# Patient Record
Sex: Male | Born: 1980 | Race: White | Hispanic: No | Marital: Married | Smoking: Never smoker
Health system: Southern US, Community
[De-identification: ages and names within clinical notes are randomized; demographics above are authoritative.]

## PROBLEM LIST (undated history)

## (undated) DIAGNOSIS — N200 Calculus of kidney: Secondary | ICD-10-CM

## (undated) HISTORY — DX: Calculus of kidney: N20.0

---

## 2002-08-17 ENCOUNTER — Emergency Department (HOSPITAL_COMMUNITY): Admission: EM | Admit: 2002-08-17 | Discharge: 2002-08-17 | Payer: Self-pay | Admitting: Emergency Medicine

## 2002-08-17 ENCOUNTER — Encounter: Payer: Self-pay | Admitting: Emergency Medicine

## 2004-02-05 HISTORY — PX: EXTRACORPOREAL SHOCK WAVE LITHOTRIPSY: SHX1557

## 2007-03-11 ENCOUNTER — Emergency Department: Payer: Self-pay | Admitting: Emergency Medicine

## 2007-03-12 ENCOUNTER — Ambulatory Visit: Payer: Self-pay | Admitting: Specialist

## 2015-06-14 ENCOUNTER — Observation Stay
Admission: EM | Admit: 2015-06-14 | Discharge: 2015-06-15 | Disposition: A | Discharge status: Home / Self Care | Payer: No Typology Code available for payment source | Attending: Urology | Admitting: Urology

## 2015-06-14 ENCOUNTER — Encounter: Payer: Self-pay | Admitting: Emergency Medicine

## 2015-06-14 ENCOUNTER — Observation Stay: Payer: No Typology Code available for payment source | Admitting: Certified Registered"

## 2015-06-14 ENCOUNTER — Encounter
Admission: EM | Disposition: A | Discharge status: Home / Self Care | Payer: Self-pay | Source: Home / Self Care | Attending: Emergency Medicine

## 2015-06-14 ENCOUNTER — Emergency Department: Payer: No Typology Code available for payment source

## 2015-06-14 DIAGNOSIS — N132 Hydronephrosis with renal and ureteral calculous obstruction: Secondary | ICD-10-CM

## 2015-06-14 DIAGNOSIS — N200 Calculus of kidney: Secondary | ICD-10-CM | POA: Diagnosis not present

## 2015-06-14 DIAGNOSIS — N179 Acute kidney failure, unspecified: Secondary | ICD-10-CM | POA: Insufficient documentation

## 2015-06-14 DIAGNOSIS — R109 Unspecified abdominal pain: Secondary | ICD-10-CM | POA: Insufficient documentation

## 2015-06-14 DIAGNOSIS — R918 Other nonspecific abnormal finding of lung field: Secondary | ICD-10-CM | POA: Diagnosis not present

## 2015-06-14 DIAGNOSIS — N134 Hydroureter: Secondary | ICD-10-CM

## 2015-06-14 DIAGNOSIS — N202 Calculus of kidney with calculus of ureter: Principal | ICD-10-CM | POA: Insufficient documentation

## 2015-06-14 DIAGNOSIS — R52 Pain, unspecified: Secondary | ICD-10-CM

## 2015-06-14 HISTORY — PX: CYSTOSCOPY/URETEROSCOPY/HOLMIUM LASER/STENT PLACEMENT: SHX6546

## 2015-06-14 LAB — CBC WITH DIFFERENTIAL/PLATELET
BASOS ABS: 0.1 10*3/uL (ref 0–0.1)
Eosinophils Absolute: 0.1 10*3/uL (ref 0–0.7)
Eosinophils Relative: 1 %
HEMATOCRIT: 45.7 % (ref 40.0–52.0)
HEMOGLOBIN: 15.7 g/dL (ref 13.0–18.0)
Lymphocytes Relative: 9 %
Lymphs Abs: 1.3 10*3/uL (ref 1.0–3.6)
MCH: 28.2 pg (ref 26.0–34.0)
MCHC: 34.4 g/dL (ref 32.0–36.0)
MCV: 82.2 fL (ref 80.0–100.0)
Monocytes Absolute: 0.6 10*3/uL (ref 0.2–1.0)
Monocytes Relative: 5 %
NEUTROS ABS: 11.6 10*3/uL — AB (ref 1.4–6.5)
Platelets: 240 10*3/uL (ref 150–440)
RBC: 5.56 MIL/uL (ref 4.40–5.90)
RDW: 13.6 % (ref 11.5–14.5)
WBC: 13.7 10*3/uL — ABNORMAL HIGH (ref 3.8–10.6)

## 2015-06-14 LAB — URINALYSIS COMPLETE WITH MICROSCOPIC (ARMC ONLY)
BACTERIA UA: NONE SEEN
BILIRUBIN URINE: NEGATIVE
GLUCOSE, UA: NEGATIVE mg/dL
Ketones, ur: NEGATIVE mg/dL
Leukocytes, UA: NEGATIVE
Nitrite: NEGATIVE
Protein, ur: NEGATIVE mg/dL
SQUAMOUS EPITHELIAL / LPF: NONE SEEN
Specific Gravity, Urine: 1.02 (ref 1.005–1.030)
pH: 6 (ref 5.0–8.0)

## 2015-06-14 LAB — BASIC METABOLIC PANEL
ANION GAP: 10 (ref 5–15)
BUN: 20 mg/dL (ref 6–20)
CALCIUM: 9.6 mg/dL (ref 8.9–10.3)
CO2: 25 mmol/L (ref 22–32)
CREATININE: 1.31 mg/dL — AB (ref 0.61–1.24)
Chloride: 102 mmol/L (ref 101–111)
GLUCOSE: 151 mg/dL — AB (ref 65–99)
Potassium: 3.6 mmol/L (ref 3.5–5.1)
Sodium: 137 mmol/L (ref 135–145)

## 2015-06-14 SURGERY — CYSTOSCOPY/URETEROSCOPY/HOLMIUM LASER/STENT PLACEMENT
Anesthesia: General | Laterality: Right

## 2015-06-14 MED ORDER — SODIUM CHLORIDE 0.9 % IV BOLUS (SEPSIS)
1000.0000 mL | Freq: Once | INTRAVENOUS | Status: AC
  Administered 2015-06-14: 1000 mL via INTRAVENOUS

## 2015-06-14 MED ORDER — HYDROMORPHONE HCL 1 MG/ML IJ SOLN
INTRAMUSCULAR | Status: AC
  Administered 2015-06-14: 1 mg via INTRAVENOUS
  Filled 2015-06-14: qty 1

## 2015-06-14 MED ORDER — ONDANSETRON HCL 4 MG/2ML IJ SOLN
4.0000 mg | Freq: Once | INTRAMUSCULAR | Status: AC
  Administered 2015-06-14: 4 mg via INTRAVENOUS

## 2015-06-14 MED ORDER — BELLADONNA ALKALOIDS-OPIUM 16.2-60 MG RE SUPP: 1.0000 | Freq: Three times a day (TID) | RECTAL | Status: DC | PRN

## 2015-06-14 MED ORDER — MORPHINE SULFATE (PF) 2 MG/ML IV SOLN
2.0000 mg | INTRAVENOUS | Status: DC | PRN
  Administered 2015-06-14 (×2): 4 mg via INTRAVENOUS
  Filled 2015-06-14 (×3): qty 2

## 2015-06-14 MED ORDER — TAMSULOSIN HCL 0.4 MG PO CAPS: 0.4000 mg | ORAL_CAPSULE | Freq: Every day | ORAL | Status: DC

## 2015-06-14 MED ORDER — MORPHINE SULFATE (PF) 4 MG/ML IV SOLN
4.0000 mg | Freq: Once | INTRAVENOUS | Status: AC
  Administered 2015-06-14: 4 mg via INTRAVENOUS
  Filled 2015-06-14: qty 1

## 2015-06-14 MED ORDER — ONDANSETRON HCL 4 MG/2ML IJ SOLN
4.0000 mg | INTRAMUSCULAR | Status: DC | PRN
  Administered 2015-06-14 (×3): 4 mg via INTRAVENOUS
  Filled 2015-06-14 (×3): qty 2

## 2015-06-14 MED ORDER — OXYBUTYNIN CHLORIDE 5 MG PO TABS
5.0000 mg | ORAL_TABLET | Freq: Three times a day (TID) | ORAL | Status: DC | PRN
  Filled 2015-06-14: qty 1

## 2015-06-14 MED ORDER — OXYCODONE-ACETAMINOPHEN 5-325 MG PO TABS: 1.0000 | ORAL_TABLET | ORAL | Status: DC | PRN

## 2015-06-14 MED ORDER — HYDROMORPHONE HCL 1 MG/ML IJ SOLN
1.0000 mg | Freq: Once | INTRAMUSCULAR | Status: AC
  Administered 2015-06-14: 1 mg via INTRAVENOUS

## 2015-06-14 MED ORDER — TAMSULOSIN HCL 0.4 MG PO CAPS
0.4000 mg | ORAL_CAPSULE | Freq: Every day | ORAL | Status: DC
  Administered 2015-06-15: 0.4 mg via ORAL
  Filled 2015-06-14: qty 1

## 2015-06-14 MED ORDER — LIDOCAINE HCL (CARDIAC) 20 MG/ML IV SOLN
INTRAVENOUS | Status: DC | PRN
  Administered 2015-06-14: 100 mg via INTRAVENOUS

## 2015-06-14 MED ORDER — CEFAZOLIN SODIUM-DEXTROSE 2-3 GM-% IV SOLR
INTRAVENOUS | Status: DC | PRN
  Administered 2015-06-14: 2 g via INTRAVENOUS

## 2015-06-14 MED ORDER — ROCURONIUM BROMIDE 100 MG/10ML IV SOLN
INTRAVENOUS | Status: DC | PRN
  Administered 2015-06-14: 30 mg via INTRAVENOUS
  Administered 2015-06-14 (×2): 5 mg via INTRAVENOUS
  Administered 2015-06-14: 15 mg via INTRAVENOUS

## 2015-06-14 MED ORDER — ONDANSETRON 4 MG PO TBDP
ORAL_TABLET | ORAL | Status: AC
  Administered 2015-06-14: 4 mg via ORAL
  Filled 2015-06-14: qty 1

## 2015-06-14 MED ORDER — ACETAMINOPHEN 325 MG PO TABS
650.0000 mg | ORAL_TABLET | ORAL | Status: DC | PRN
  Administered 2015-06-15 (×2): 650 mg via ORAL
  Filled 2015-06-14 (×2): qty 2

## 2015-06-14 MED ORDER — TAMSULOSIN HCL 0.4 MG PO CAPS
0.4000 mg | ORAL_CAPSULE | Freq: Once | ORAL | Status: AC
  Administered 2015-06-14: 0.4 mg via ORAL
  Filled 2015-06-14: qty 1

## 2015-06-14 MED ORDER — PROPOFOL 10 MG/ML IV BOLUS
INTRAVENOUS | Status: DC | PRN
Start: 2015-06-14 — End: 2015-06-14
  Administered 2015-06-14: 200 mg via INTRAVENOUS

## 2015-06-14 MED ORDER — SUCCINYLCHOLINE CHLORIDE 20 MG/ML IJ SOLN
INTRAMUSCULAR | Status: DC | PRN
  Administered 2015-06-14: 100 mg via INTRAVENOUS

## 2015-06-14 MED ORDER — SUGAMMADEX SODIUM 500 MG/5ML IV SOLN
INTRAVENOUS | Status: DC | PRN
  Administered 2015-06-14: 198.2 mg via INTRAVENOUS

## 2015-06-14 MED ORDER — DIPHENHYDRAMINE HCL 50 MG/ML IJ SOLN: 12.5000 mg | Freq: Four times a day (QID) | INTRAMUSCULAR | Status: DC | PRN

## 2015-06-14 MED ORDER — SODIUM CHLORIDE 0.9 % IR SOLN
Status: DC | PRN
  Administered 2015-06-14: 3000 mL

## 2015-06-14 MED ORDER — DIPHENHYDRAMINE HCL 12.5 MG/5ML PO ELIX
12.5000 mg | ORAL_SOLUTION | Freq: Four times a day (QID) | ORAL | Status: DC | PRN
  Filled 2015-06-14: qty 5

## 2015-06-14 MED ORDER — FENTANYL CITRATE (PF) 100 MCG/2ML IJ SOLN: 25.0000 ug | INTRAMUSCULAR | Status: DC | PRN

## 2015-06-14 MED ORDER — DOCUSATE SODIUM 100 MG PO CAPS
100.0000 mg | ORAL_CAPSULE | Freq: Two times a day (BID) | ORAL | Status: DC
  Administered 2015-06-14 – 2015-06-15 (×2): 100 mg via ORAL
  Filled 2015-06-14 (×2): qty 1

## 2015-06-14 MED ORDER — ONDANSETRON HCL 4 MG/2ML IJ SOLN
INTRAMUSCULAR | Status: AC
  Administered 2015-06-14: 4 mg via INTRAVENOUS
  Filled 2015-06-14: qty 2

## 2015-06-14 MED ORDER — NON FORMULARY: 12.5000 mg | Freq: Once | Status: DC

## 2015-06-14 MED ORDER — SODIUM CHLORIDE 0.9 % IV SOLN
INTRAVENOUS | Status: DC
  Administered 2015-06-14 (×3): via INTRAVENOUS
  Administered 2015-06-14: 1000 mL via INTRAVENOUS
  Administered 2015-06-15: 07:00:00 via INTRAVENOUS

## 2015-06-14 MED ORDER — IOTHALAMATE MEGLUMINE 43 % IV SOLN
INTRAVENOUS | Status: DC | PRN
  Administered 2015-06-14: 15 mL via SURGICAL_CAVITY

## 2015-06-14 MED ORDER — DOCUSATE SODIUM 100 MG PO CAPS: 100.0000 mg | ORAL_CAPSULE | Freq: Two times a day (BID) | ORAL | Status: DC

## 2015-06-14 MED ORDER — ONDANSETRON 4 MG PO TBDP
4.0000 mg | ORAL_TABLET | Freq: Once | ORAL | Status: AC
  Administered 2015-06-14: 4 mg via ORAL

## 2015-06-14 MED ORDER — OXYBUTYNIN CHLORIDE 5 MG PO TABS: 5.0000 mg | ORAL_TABLET | Freq: Three times a day (TID) | ORAL | Status: DC | PRN

## 2015-06-14 MED ORDER — FENTANYL CITRATE (PF) 100 MCG/2ML IJ SOLN
INTRAMUSCULAR | Status: DC | PRN
  Administered 2015-06-14: 100 ug via INTRAVENOUS

## 2015-06-14 MED ORDER — ONDANSETRON HCL 4 MG/2ML IJ SOLN: 4.0000 mg | Freq: Once | INTRAMUSCULAR | Status: DC | PRN

## 2015-06-14 SURGICAL SUPPLY — 34 items
ADAPTER SCOPE UROLOK II (MISCELLANEOUS) IMPLANT
BAG DRAIN CYSTO-URO LG1000N (MISCELLANEOUS) ×3 IMPLANT
BASKET LASER NITINOL 1.9FR (BASKET) ×3 IMPLANT
BASKET ZERO TIP 1.9FR (BASKET) ×3 IMPLANT
CATH URETL 5X70 OPEN END (CATHETERS) ×3 IMPLANT
CNTNR SPEC 2.5X3XGRAD LEK (MISCELLANEOUS) ×1
CONRAY 43 FOR UROLOGY 50M (MISCELLANEOUS) ×3 IMPLANT
CONT SPEC 4OZ STER OR WHT (MISCELLANEOUS) ×2
CONTAINER SPEC 2.5X3XGRAD LEK (MISCELLANEOUS) ×1 IMPLANT
DRAPE UTILITY 15X26 TOWEL STRL (DRAPES) ×3 IMPLANT
EZ SCUB ×3 IMPLANT
GLOVE BIO SURGEON STRL SZ 6.5 (GLOVE) ×4 IMPLANT
GLOVE BIO SURGEONS STRL SZ 6.5 (GLOVE) ×2
GOWN STRL REUS W/ TWL LRG LVL3 (GOWN DISPOSABLE) ×2 IMPLANT
GOWN STRL REUS W/TWL LRG LVL3 (GOWN DISPOSABLE) ×4
GUIDEWIRE GREEN .038 145CM (MISCELLANEOUS) ×3 IMPLANT
INTRODUCER DILATOR DOUBLE (INTRODUCER) IMPLANT
IV NS 1000ML (IV SOLUTION) ×2
IV NS 1000ML BAXH (IV SOLUTION) ×1 IMPLANT
KIT RM TURNOVER CYSTO AR (KITS) ×3 IMPLANT
LASER FIBER 200M SMARTSCOPE (Laser) ×3 IMPLANT
PACK CYSTO AR (MISCELLANEOUS) ×3 IMPLANT
PREP PVP WINGED SPONGE (MISCELLANEOUS) IMPLANT
PUMP SINGLE ACTION SAP (PUMP) IMPLANT
SENSORWIRE 0.038 NOT ANGLED (WIRE) ×6
SET CYSTO W/LG BORE CLAMP LF (SET/KITS/TRAYS/PACK) ×3 IMPLANT
SHEATH URETERAL 12FRX35CM (MISCELLANEOUS) IMPLANT
SOL .9 NS 3000ML IRR  AL (IV SOLUTION) ×2
SOL .9 NS 3000ML IRR UROMATIC (IV SOLUTION) ×1 IMPLANT
STENT URET 6FRX24 CONTOUR (STENTS) ×3 IMPLANT
STENT URET 6FRX26 CONTOUR (STENTS) IMPLANT
SURGILUBE 2OZ TUBE FLIPTOP (MISCELLANEOUS) ×3 IMPLANT
WATER STERILE IRR 1000ML POUR (IV SOLUTION) ×3 IMPLANT
WIRE SENSOR 0.038 NOT ANGLED (WIRE) ×2 IMPLANT

## 2015-06-14 NOTE — Discharge Instructions (Signed)
You have a ureteral stent in place.  This is a tube that extends from your kidney to your bladder.  This may cause urinary bleeding, burning with urination, and urinary frequency.  Please call our office or present to the ED if you develop fevers >101 or pain which is not able to be controlled with oral pain medications.  You may be given either Flomax and/ or ditropan to help with bladder spasms and stent pain in addition to pain medications.    Your stent is on a string. On Monday, May 15, untaped the stent string and pulled gently. Your remove the entire stent at that time. If you have any concerns, please call our office. He will follow-up in 4 weeks with a renal ultrasound to ensure that the kidney has healed nicely. We will discuss stone prevention and stone analysis at that time.  [facility] [address], [address] [place], [place] [address] [phone]

## 2015-06-14 NOTE — Plan of Care (Signed)
Problem: Fluid Volume: Goal: Ability to maintain a balanced intake and output will improve Outcome: Progressing Pt vomited once of clear emesis. Zofran given with improvement. Urine strained, no stone passed. Morphine given once for headache with good effect. Pt had R flank pain in am, but declines pain med. Pt denies R flank pain throughout the shift. Lithotripsy and R ureteral stent placement pending. Consent signed.

## 2015-06-14 NOTE — Anesthesia Preprocedure Evaluation (Signed)
Anesthesia Evaluation  Patient identified by MRN, date of birth, ID band Patient awake    Reviewed: Allergy & Precautions, H&P , NPO status , Patient's Chart, lab work & pertinent test results, reviewed documented beta blocker date and time   Airway Mallampati: II  TM Distance: >3 FB Neck ROM: full    Dental  (+) Teeth Intact   Pulmonary neg pulmonary ROS,    Pulmonary exam normal        Cardiovascular negative cardio ROS Normal cardiovascular exam Rhythm:regular Rate:Normal     Neuro/Psych negative neurological ROS  negative psych ROS   GI/Hepatic negative GI ROS, Neg liver ROS,   Endo/Other  negative endocrine ROS  Renal/GU Renal diseasenegative Renal ROS  negative genitourinary   Musculoskeletal   Abdominal   Peds  Hematology negative hematology ROS (+)   Anesthesia Other Findings History reviewed. No pertinent past medical history. History reviewed. No pertinent surgical history. BMI    Body Mass Index   32.25 kg/m 2     Reproductive/Obstetrics negative OB ROS                             Anesthesia Physical Anesthesia Plan  ASA: II and emergent  Anesthesia Plan: General ETT   Post-op Pain Management:    Induction:   Airway Management Planned:   Additional Equipment:   Intra-op Plan:   Post-operative Plan:   Informed Consent: I have reviewed the patients History and Physical, chart, labs and discussed the procedure including the risks, benefits and alternatives for the proposed anesthesia with the patient or authorized representative who has indicated his/her understanding and acceptance.   Dental Advisory Given  Plan Discussed with: CRNA  Anesthesia Plan Comments:         Anesthesia Quick Evaluation

## 2015-06-14 NOTE — Transfer of Care (Signed)
Immediate Anesthesia Transfer of Care Note  Patient: Boneta LucksJeremy D Brummell  Procedure(s) Performed: Procedure(s): CYSTOSCOPY/  URETEROSCOPY /HOLMIUM LASERLITHOTRIPSY / STENT PLACEMENT (Right)  Patient Location: PACU  Anesthesia Type:General  Level of Consciousness: awake and patient cooperative  Airway & Oxygen Therapy: Patient Spontanous Breathing and Patient connected to face mask oxygen  Post-op Assessment: Report given to RN and Post -op Vital signs reviewed and stable  Post vital signs: Reviewed and stable  Last Vitals:  Filed Vitals:   06/14/15 0544 06/14/15 1233  BP: 145/95 139/80  Pulse: 85 86  Temp: 36.6 C 36.8 C  Resp: 20 20    Last Pain:  Filed Vitals:   06/14/15 1413  PainSc: 0-No pain         Complications: No apparent anesthesia complications

## 2015-06-14 NOTE — Op Note (Signed)
Date of procedure: 06/14/2015  Preoperative diagnosis:  1. Right ureteral stones 3 2. Flank pain 3. Nonobstructing bilateral nephrolithiasis   Postoperative diagnosis:  1. Same as above   Procedure: 1. Right ureteroscopy 2. Laser lithotripsy 3. Right ureteral stent placement 4. Basket extraction of stone fragments 5. Retrograde pyelogram   Surgeon: Vanna Scotland, MD  Anesthesia: General  Complications: None  Intraoperative findings: 3 right-sided ureteral stones identified, round, spherical, proximally 7 mm each.   EBL: Minimal   Specimens: Stone fragment   Drains: 6 x 24 French double-J ureteral stent on right (ring left in place)   Indication: Garrett Phillips is a 35 y.o. patient with severe right flank pain found to have 3 ureteral stones measuring approximate 7 mm each. His pain was poorly controlled therefore is admitted for observation.  After reviewing the management options for treatment, he elected to proceed with the above surgical procedure(s). We have discussed the potential benefits and risks of the procedure, side effects of the proposed treatment, the likelihood of the patient achieving the goals of the procedure, and any potential problems that might occur during the procedure or recuperation. Informed consent has been obtained.  Description of procedure:  The patient was taken to the operating room and general anesthesia was induced.  The patient was placed in the dorsal lithotomy position, prepped and draped in the usual sterile fashion, and preoperative antibiotics were administered. A preoperative time-out was performed.   A rigid 21 French cystoscope was advanced per urethra into the bladder. Attention was turned to the right ureteral orifice which was cannulated using a 5 [place] open-ended ureteral catheter. A gentle retrograde powder was performed which revealed a decompressed ureter and collecting system without overt hydronephrosis. There were some  filling defects within the distal ureter consistent with his known stones. The wire was then placed up to level of the kidney without difficulty. This was snapped in place. A semirigid ureteroscope was advanced alongside the wire to the first stone was encountered several centimeters within the distal ureter. A 200  laser fiber was then brought in and using the settings of 0.8 J and 10 Hz, the stone was fragmented into multiple smaller pieces. The scope was then advanced a few centimeters further until the second stone was identified. This was also fragmented into very small pieces. Finally, a third stone was found to the level of the iliacs which was also obliterated using the laser fiber. A 1.9 French to plus nitinol basket was then used to extract each and every one of these fragments until the ureter was completely clear of all stone debris. A second Super Stiff wire was then advanced through the scope up to level of the kidney under fluoroscopic guidance. A flexible ureteroscope was then advanced over the Super Stiff wire up to level of the renal pelvis without difficulty. I did not treat the nonobstructing stones on the side today. The scope was then backed down the length of the ureter carefully inspecting along the way. There were no residual stone fragments identified and no ureteral injuries. There was some edema at the location of his previous stones. The scope was then removed and the fragments were passed off the field as stone specimen. The safety wire was then backloaded over a rigid cystoscope and a 6 x 24 French double-J ureteral stent was advanced over the wire up to level of the renal pelvis. The wire was partially drawn until full coil was noted within the renal pelvis. The  wire was then fully withdrawn until full coil was noted within the bladder. The string was left on the stent. The bladder was then drained. The stent string was secured to the patient's glans using Mastisol and Tegaderm.  Patient was then cleaned and dried, repositioned the supine position, and taken to the PACU in stable condition.  Plan: Given the patient's severe nausea and pain, will keep him overnight for continued observation. He will be discharged in the AM.    His stent may be removed on Monday next week. He'll follow-up in 4 weeks with a renal US.  We may consider metabolic work up at that time.    Vanna ScotlandAshley Christpoher Sievers, M.D.

## 2015-06-14 NOTE — Anesthesia Procedure Notes (Signed)
Procedure Name: Intubation Performed by: Casey BurkittHOANG, Buryl Bamber Pre-anesthesia Checklist: Emergency Drugs available, Patient identified, Suction available, Patient being monitored and Timeout performed Patient Re-evaluated:Patient Re-evaluated prior to inductionOxygen Delivery Method: Circle system utilized Preoxygenation: Pre-oxygenation with 100% oxygen Intubation Type: IV induction Ventilation: Two handed mask ventilation required Laryngoscope Size: Glidescope and 3 Grade View: Grade I Tube type: Oral Tube size: 7.5 mm Number of attempts: 1 Airway Equipment and Method: Rigid stylet and Video-laryngoscopy Placement Confirmation: positive ETCO2,  ETT inserted through vocal cords under direct vision and breath sounds checked- equal and bilateral Secured at: 22 cm Tube secured with: Tape Dental Injury: Teeth and Oropharynx as per pre-operative assessment  Difficulty Due To: Difficult Airway- due to anterior larynx

## 2015-06-14 NOTE — ED Notes (Signed)
Patient transported to CT 

## 2015-06-14 NOTE — ED Notes (Signed)
Pt in with co right flank pain hx of kidney stones states feels the same.

## 2015-06-14 NOTE — H&P (Addendum)
Placed in observation for renal colic.  Patient placed on  continuous 02 due to room air sat of 85%.  Discussed during progression of need to wean.  There is no documented previous medical history of chronic cardiopulmonary disease. It is reported sats were low due to high doses of analgesics in the ED.  Reinforced the need to assess room air sats prior to discharge.  Independent in all adls, denies issues accessing medical care, obtaining medications or with transportation.    No discharge needs identified at present by care manager or members of care team

## 2015-06-14 NOTE — ED Provider Notes (Signed)
[facility] Emergency Department Provider Note   ____________________________________________  Time seen: Approximately 2:10 AM  I have reviewed the triage vital signs and the nursing notes.   HISTORY  Chief Complaint Flank Pain    HPI Garrett Phillips is a 35 y.o. male who presents to the ED from home with a chief complaint of right flank pain. Onset of right flank pain yesterday morning which resolved by the afternoon. Pain recurred approximately 10 PM. Sharp, stabbing right flank pain associated with nausea and dry heaves. Patient reports a history of kidney stones and states this feels similarly. Denies associated fever, chills, chest pain, shortness breath, abdominal pain, vomiting, diarrhea. States he feels like he needs to urinate but only dribbles when he does. Denies recent travel or trauma. Nothing makes his pain better or worse.   Past medical history Nephrolithiasis  There are no active problems to display for this patient.   Past surgical history Lithotripsy   No current outpatient prescriptions on file.  Allergies Review of patient's allergies indicates no known allergies.  No family history on file.  Social History Social History  Substance Use Topics  . Smoking status: Not on file  . Smokeless tobacco: Not on file  . Alcohol Use: Not on file  No recent EtOH  Review of Systems  Constitutional: No fever/chills. Eyes: No visual changes. ENT: No sore throat. Cardiovascular: Denies chest pain. Respiratory: Denies shortness of breath. Gastrointestinal: No abdominal pain.  Positive for nausea, no vomiting.  No diarrhea.  No constipation.  Positive for right flank pain. Genitourinary: Positive for hesitancy. Negative for dysuria. Musculoskeletal: Negative for back pain. Skin: Negative for rash. Neurological: Negative for headaches, focal weakness or numbness.  10-point ROS otherwise  negative.  ____________________________________________   PHYSICAL EXAM:  VITAL SIGNS: ED Triage Vitals  Enc Vitals Group     BP 06/14/15 0137 141/95 mmHg     Pulse Rate 06/14/15 0136 69     Resp 06/14/15 0136 18     Temp 06/14/15 0136 98.2 F (36.8 C)     Temp Source 06/14/15 0136 Oral     SpO2 06/14/15 0136 97 %     Weight 06/14/15 0136 210 lb (95.255 kg)     Height 06/14/15 0136 5\' 9"  (1.753 m)     Head Cir --      Peak Flow --      Pain Score 06/14/15 0137 10     Pain Loc --      Pain Edu? --      Excl. in GC? --     Constitutional: Alert and oriented. Well appearing and in moderate acute distress. Eyes: Conjunctivae are normal. PERRL. EOMI. Head: Atraumatic. Nose: No congestion/rhinnorhea. Mouth/Throat: Mucous membranes are moist.  Oropharynx non-erythematous. Neck: No stridor.   Cardiovascular: Normal rate, regular rhythm. Grossly normal heart sounds.  Good peripheral circulation. Respiratory: Normal respiratory effort.  No retractions. Lungs CTAB. Gastrointestinal: Soft and nontender. No distention. No abdominal bruits. No CVA tenderness. Musculoskeletal: No lower extremity tenderness nor edema.  No joint effusions. Neurologic:  Normal speech and language. No gross focal neurologic deficits are appreciated. No gait instability. Skin:  Skin is pale, warm, diaphoretic and intact. No rash noted. Psychiatric: Mood and affect are normal. Speech and behavior are normal.  ____________________________________________   LABS (all labs ordered are listed, but only abnormal results are displayed)  Labs Reviewed  CBC WITH DIFFERENTIAL/PLATELET - Abnormal; Notable for the following:    WBC 13.7 (*)  Neutro Abs 11.6 (*)    All other components within normal limits  BASIC METABOLIC PANEL - Abnormal; Notable for the following:    Glucose, Bld 151 (*)    Creatinine, Ser 1.31 (*)    All other components within normal limits  URINALYSIS COMPLETEWITH MICROSCOPIC ([facility]  ONLY)   ____________________________________________  EKG  None ____________________________________________  RADIOLOGY  CT renal stone study interpreted per Dr. Clovis Riley: 1. Three right ureteral calculi, including a 5 x 7 mm distal ureteral calculus 1.5 cm above the UVJ, a 5 x 7 mm proximal ureteral calculus at the low L3 level, and a 6 mm nonobstructing calculus at the ureteropelvic junction. Marked hydronephrosis and hydroureter. 2. Bilateral nephrolithiasis. 3. Benign 7 mm nodule in the posterolateral periphery of the left lower lobe, not requiring additional evaluation. ____________________________________________   PROCEDURES  Procedure(s) performed: None  Critical Care performed: No  ____________________________________________   INITIAL IMPRESSION / ASSESSMENT AND PLAN / ED COURSE  Pertinent labs & imaging results that were available during my care of the patient were reviewed by me and considered in my medical decision making (see chart for details).  35 year old male who presents with sudden onset right flank pain associated with nausea. History of kidney stones. Will check screening lab work, administer IV analgesia and IV fluid resuscitation; obtain CT renal colic study.  ----------------------------------------- 4:20 AM on 06/14/2015 -----------------------------------------  Patient still in significant pain 7/10 after 3 rounds of IV analgesia and oral Flomax. Discussed case with Dr. Apolinar Junes from urology who will admit patient. Additional IV analgesia ordered. Patient and spouse updated and agreeable with plan of care. ____________________________________________   FINAL CLINICAL IMPRESSION(S) / ED DIAGNOSES  Final diagnoses:  Kidney stone  Hydronephrosis with urinary obstruction due to ureteral calculus  Hydroureter      NEW MEDICATIONS STARTED DURING THIS VISIT:  New Prescriptions   No medications on file     Note:  This document was  prepared using Dragon voice recognition software and may include unintentional dictation errors.    Irean Hong, MD 06/14/15 [phone]

## 2015-06-14 NOTE — ED Notes (Signed)
Patient placed on 2L of oxygen due to recent oxygen reading. Provider made aware.

## 2015-06-14 NOTE — H&P (Signed)
Urology Consult  I have been asked to see the patient by Dr. Fortunato Curling, for evaluation and management of .  Chief Complaint:  Right flank pain  History of Present Illness: Garrett Phillips is a 35 y.o. year old with a history of nephrolithiasis who presented to the emergency room overnight with severe onset of right flank pain. He also had associated nausea and vomiting without fevers or chills. CT scan revealed a distinct ureteral stones within the right ureter including a 6 mm UPJ stone, 7 mm proximal stone and a distal 7 mm stone. There is associated hydroureteronephrosis.    Creatinine 1.3. WBC 13.7. UA negative other than for blood.  Fevers or chills. Hemodynamically stable.  In the emergency room, his pain is very difficult to control and he was admitted for pain management and possible definitive management of his stones.  He has passed stones in the past, most recently 4 years ago. He has seen [facility] around that time. He is also had ESWL times one which he reports was not successful.  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  History reviewed. No pertinent past medical history.  History reviewed. No pertinent past surgical history.  Home Medications:    Medication List    Notice    You have not been prescribed any medications.      Allergies: No Known Allergies  No family history on file.  Patient denies family history of kidney stones.  Social History:  reports that he has never smoked. He does not have any smokeless tobacco history on file. He reports that he does not drink alcohol or use illicit drugs.  ROS: A complete review of systems was performed.  All systems are negative except for pertinent findings as noted.  Physical Exam:  Vital signs in last 24 hours: Temp:  [97.9 F (36.6 C)-98.6 F (37 C)] 97.9 F (36.6 C) (05/10 0544) Pulse Rate:  [65-85] 85 (05/10 0544) Resp:  [12-20] 20 (05/10 0544) BP:  (128-145)/(86-95) 145/95 mmHg (05/10 0544) SpO2:  [85 %-97 %] 96 % (05/10 0544) Weight:  [210 lb (95.255 kg)-218 lb 8 oz (99.111 kg)] 218 lb 8 oz (99.111 kg) (05/10 0544) Constitutional:  Alert and oriented, Mild distress, vomiting during exam. HEENT: [place] AT, moist mucus membranes.  Trachea midline, no masses Cardiovascular: Regular rate and rhythm, no clubbing, cyanosis, or edema. Respiratory: Normal respiratory effort, lungs clear bilaterally GI: Abdomen is soft, nontender, nondistended, no abdominal masses GU: Mild R CVA tenderness. Skin: No rashes, bruises or suspicious lesions Lymph: No cervical or inguinal adenopathy Neurologic: Grossly intact, no focal deficits, moving all 4 extremities Psychiatric: Normal mood and affect   Laboratory Data:   Recent Labs  06/14/15 0153  WBC 13.7*  HGB 15.7  HCT 45.7    Recent Labs  06/14/15 0153  NA 137  K 3.6  CL 102  CO2 25  GLUCOSE 151*  BUN 20  CREATININE 1.31*  CALCIUM 9.6   Component     Latest Ref Rng 06/14/2015  Color, Urine     YELLOW YELLOW (A)  Appearance     CLEAR HAZY (A)  Glucose     NEGATIVE mg/dL NEGATIVE  Bilirubin Urine     NEGATIVE NEGATIVE  Ketones, ur     NEGATIVE mg/dL NEGATIVE  Specific Gravity, Urine     1.005 - 1.030 1.020  Hgb urine dipstick     NEGATIVE 3+ (A)  pH     5.0 - 8.0 6.0  Protein     NEGATIVE mg/dL NEGATIVE  Nitrite     NEGATIVE NEGATIVE  Leukocytes, UA     NEGATIVE NEGATIVE  RBC / HPF     0 - 5 RBC/hpf TOO NUMEROUS TO COUNT  WBC, UA     0 - 5 WBC/hpf 0-5  Bacteria, UA     NONE SEEN NONE SEEN  Squamous Epithelial / LPF     NONE SEEN NONE SEEN  Mucous      PRESENT    Radiologic Imaging: Ct Renal Stone Study  06/14/2015  CLINICAL DATA:  Right flank pain. EXAM: CT ABDOMEN AND PELVIS WITHOUT CONTRAST TECHNIQUE: Multidetector CT imaging of the abdomen and pelvis was performed following the standard protocol without IV contrast. COMPARISON:  03/11/2007 FINDINGS: There are  multiple renal calculi bilaterally. On the right there are at least 11 in number, measuring up to 7.5 mm. On the left there are at least 4 calculi, measuring up to 5 mm. There are 3 right ureteral calculi. At the ureteropelvic junction there is a nonobstructing 6 mm calculus. At the low L3 level there is a 5 x 7 mm proximal right ureteral calculus. In the distal right ureter there is another 5 x 7 mm calculus, 1.5 cm from the ureterovesical junction. There is marked hydroureter and hydronephrosis on the right. There are no left ureteral, no significant renal parenchymal lesions are evident on this unenhanced scan. There are unremarkable unenhanced appearances of the liver, gallbladder, bile ducts, spleen, pancreas and adrenals. The abdominal aorta is normal in caliber. There is no atherosclerotic calcification. There is no adenopathy in the abdomen or pelvis. There are normal appearances of the stomach, small bowel and colon. The appendix is normal. Mild atelectatic appearing posterior lung base opacities are present. There is a noncalcified 7 mm nodule in the posterior lateral periphery of the left lower lobe. This is unchanged from 03/11/2007 and is benign. There is no significant skeletal lesion. IMPRESSION: 1. Three right ureteral calculi, including a 5 x 7 mm distal ureteral calculus 1.5 cm above the UVJ, a 5 x 7 mm proximal ureteral calculus at the low L3 level, and a 6 mm nonobstructing calculus at the ureteropelvic junction. Marked hydronephrosis and hydroureter. 2. Bilateral nephrolithiasis. 3. Benign 7 mm nodule in the posterolateral periphery of the left lower lobe, not requiring additional evaluation. Electronically Signed   By: Ellery Plunk M.D.   On: 06/14/2015 03:25   CT scan personally reviewed  Impression/Assessment:  35 yo M with R flank pain found to have 3 distinct right ureteral stones measuring up to 7 mm in size with proximal hydroureteronephrosis. He also has multiple nonobstructing  stones, right greater than left.  He is hemodynamically stable, afebrile without concern for active infection. Plan to admit patient for pain control in we'll likely add patient on to the OR schedule later today for treatment of his obstructing calculi.   We discussed various treatment options including medical expulsive therapy ESWL vs. ureteroscopy, laser lithotripsy, and stent. We discussed the risks and benefits of both including bleeding, infection, damage to surrounding structures, efficacy with need for possible further intervention, and need for temporary ureteral stent.   After a lengthy discussion, he would like to be added on to the OR schedule for this afternoon for ureteroscopy.   1. Obstructing right ureteral stone x 3-  2. Flank pain  3. Hydronephrsois  4. AKI  Plan:  -Nothing  by mouth -consent for right ureteroscopy, laser lithotripsy, right ureteral stent obtained, scheduled as add on later today -IVF @150  -strain urine -pain control -nausea medication  06/14/2015, 7:53 AM  Vanna ScotlandAshley Nobuko Gsell,  MD

## 2015-06-15 ENCOUNTER — Telehealth: Payer: Self-pay | Admitting: Urology

## 2015-06-15 ENCOUNTER — Encounter: Payer: Self-pay | Admitting: Urology

## 2015-06-15 DIAGNOSIS — N134 Hydroureter: Secondary | ICD-10-CM | POA: Diagnosis not present

## 2015-06-15 DIAGNOSIS — R52 Pain, unspecified: Secondary | ICD-10-CM | POA: Diagnosis not present

## 2015-06-15 DIAGNOSIS — N132 Hydronephrosis with renal and ureteral calculous obstruction: Secondary | ICD-10-CM | POA: Diagnosis not present

## 2015-06-15 DIAGNOSIS — N200 Calculus of kidney: Secondary | ICD-10-CM | POA: Diagnosis not present

## 2015-06-15 NOTE — Discharge Summary (Signed)
Date of admission: 06/14/2015  Date of discharge: 06/15/2015  Admission diagnosis: Right ureteral stone, right flank pain, nausea/ vomiting, AKI  Discharge diagnosis: same as above  Secondary diagnoses:  Patient Active Problem List   Diagnosis Date Noted  . Kidney stone 06/14/2015    History and Physical: For full details, please see admission history and physical. Briefly, Garrett Phillips is a 35 y.o. year old patient with 3 ureteral stones, severe uncontrolled pain, and nausea vomiting.  He was admitted for pain control.    Hospital Course: Difficulty controlling pain therefore taken to OR for R URS, LL, stent placement.  Patient tolerated the procedure well.  He was then transferred to the floor after an uneventful PACU stay.  His hospital course was uncomplicated.  On POD#1 he had met discharge criteria: was eating a regular diet, was up and ambulating independently,  pain was well controlled, was voiding without a catheter, and was ready to for discharge.    Physical Exam  Constitutional: He is oriented to person, place, and time. He appears well-developed and well-nourished.  HENT:  Head: Normocephalic.  Neck: Normal range of motion.  Cardiovascular: Normal rate.   Pulmonary/Chest: Effort normal and breath sounds normal.  Abdominal: Soft. Bowel sounds are normal.  Genitourinary: Penis normal.  No cva tenderness bilaterally  Musculoskeletal: Normal range of motion.  Neurological: He is alert and oriented to person, place, and time.  Skin: Skin is warm and dry.  Psychiatric: He has a normal mood and affect.  Vitals reviewed.   Laboratory values:   Recent Labs  06/14/15 0153  WBC 13.7*  HGB 15.7  HCT 45.7    Recent Labs  06/14/15 0153  NA 137  K 3.6  CL 102  CO2 25  GLUCOSE 151*  BUN 20  CREATININE 1.31*  CALCIUM 9.6   Disposition: Home  Discharge instruction: You have a ureteral stent in place.  This is a tube that extends from your kidney to your  bladder.  This may cause urinary bleeding, burning with urination, and urinary frequency.  Please call our office or present to the ED if you develop fevers >101 or pain which is not able to be controlled with oral pain medications.  You may be given either Flomax and/ or ditropan to help with bladder spasms and stent pain in addition to pain medications.    [facility] [address], [place], [place] [address] [phone]   Discharge medications:   Medication List    TAKE these medications        docusate sodium 100 MG capsule  Commonly known as:  COLACE  Take 1 capsule (100 mg total) by mouth 2 (two) times daily.     oxybutynin 5 MG tablet  Commonly known as:  DITROPAN  Take 1 tablet (5 mg total) by mouth every 8 (eight) hours as needed for bladder spasms.     oxyCODONE-acetaminophen 5-325 MG tablet  Commonly known as:  PERCOCET  Take 1-2 tablets by mouth every 4 (four) hours as needed for moderate pain or severe pain.     tamsulosin 0.4 MG Caps capsule  Commonly known as:  FLOMAX  Take 1 capsule (0.4 mg total) by mouth daily.        Followup:      Follow-up Information    Follow up with Garrett Espy, MD In 4 weeks.   Specialty:  Urology   Why:  For follow up with renal ultrasound prior to visit  Contact information:   [address] [place] [address] [phone]

## 2015-06-15 NOTE — Anesthesia Postprocedure Evaluation (Signed)
Anesthesia Post Note  Patient: Boneta LucksJeremy D Bednarz  Procedure(s) Performed: Procedure(s) (LRB): CYSTOSCOPY/  URETEROSCOPY /HOLMIUM LASERLITHOTRIPSY / STENT PLACEMENT (Right)  Patient location during evaluation: PACU Anesthesia Type: General Level of consciousness: awake and alert Pain management: pain level controlled Vital Signs Assessment: post-procedure vital signs reviewed and stable Respiratory status: spontaneous breathing, nonlabored ventilation, respiratory function stable and patient connected to nasal cannula oxygen Cardiovascular status: blood pressure returned to baseline and stable Postop Assessment: no signs of nausea or vomiting Anesthetic complications: no    Last Vitals:  Filed Vitals:   06/14/15 2012 06/14/15 2027  BP: 134/82 133/82  Pulse: 103 101  Temp: 36.6 C 37.1 C  Resp: 23 18    Last Pain:  Filed Vitals:   06/14/15 2057  PainSc: 0-No pain                 Yevette EdwardsJames G Dalon Reichart

## 2015-06-15 NOTE — Care Management (Signed)
No discharge needs identified by care team 

## 2015-06-15 NOTE — Telephone Encounter (Signed)
I had to schd mr.Manganello for the 14th. Is this ok?  Marcelino DusterMichelle

## 2015-06-20 ENCOUNTER — Ambulatory Visit: Payer: Self-pay

## 2015-06-22 LAB — STONE ANALYSIS
CA PHOS CRY STONE QL IR: 3 %
Ca Oxalate,Monohydr.: 97 %
STONE WEIGHT KSTONE: 12 mg

## 2015-07-12 ENCOUNTER — Ambulatory Visit
Admission: RE | Admit: 2015-07-12 | Discharge: 2015-07-12 | Disposition: A | Discharge status: Home / Self Care | Payer: No Typology Code available for payment source | Source: Ambulatory Visit | Attending: Urology | Admitting: Urology

## 2015-07-12 DIAGNOSIS — N200 Calculus of kidney: Secondary | ICD-10-CM | POA: Diagnosis present

## 2015-07-19 ENCOUNTER — Encounter: Payer: Self-pay | Admitting: Urology

## 2015-07-19 ENCOUNTER — Ambulatory Visit (INDEPENDENT_AMBULATORY_CARE_PROVIDER_SITE_OTHER): Payer: PRIVATE HEALTH INSURANCE | Admitting: Urology

## 2015-07-19 VITALS — BP 152/91 | HR 73 | Ht 69.0 in | Wt 212.0 lb

## 2015-07-19 DIAGNOSIS — N200 Calculus of kidney: Secondary | ICD-10-CM

## 2015-07-19 LAB — URINALYSIS, COMPLETE
BILIRUBIN UA: NEGATIVE
Glucose, UA: NEGATIVE
KETONES UA: NEGATIVE
LEUKOCYTES UA: NEGATIVE
Nitrite, UA: NEGATIVE
PH UA: 5.5 (ref 5.0–7.5)
PROTEIN UA: NEGATIVE
RBC, UA: NEGATIVE
SPEC GRAV UA: 1.025 (ref 1.005–1.030)
UUROB: 0.2 mg/dL (ref 0.2–1.0)

## 2015-07-19 LAB — MICROSCOPIC EXAMINATION
BACTERIA UA: NONE SEEN
Epithelial Cells (non renal): NONE SEEN /hpf (ref 0–10)

## 2015-07-19 NOTE — Progress Notes (Signed)
07/19/2015 8:52 AM   Garrett Phillips November 25, 1980 [id number]  Referring provider: No referring provider defined for this encounter.  Chief Complaint  Patient presents with  . Nephrolithiasis    New Patient  . Results    4wk u/s    HPI: 35 year old male who was admitted for pain control on 06/14/15 who ultimately underwent with 3 - 7 mm right ureteral stones who underwent right URS, LL, stent.  He was discharged home without any further complications. He has since removed his own stent. Follow-up renal ultrasound shows no evidence of residual hydronephrosis.  He does have bilateral nonobstructing stones, right greater than left up to 8 mm.    He has passed stones in the past, most recently 4 years ago. He has seen [facility] around that time. He is also had ESWL times one which he reports was not successful.  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  Stone analysis shows stone composition calcium oxalate monohydrate 97%, calcium phosphate 3%.  No urinary symptoms today.  He denies any dysuria or gross hematuria. No episodes of recent flank pain since the stent was removed.  PMH: Past Medical History  Diagnosis Date  . Nephrolithiasis     Surgical History: Past Surgical History  Procedure Laterality Date  . Cystoscopy/ureteroscopy/holmium laser/stent placement Right 06/14/2015    Procedure: CYSTOSCOPY/  URETEROSCOPY /HOLMIUM LASERLITHOTRIPSY / STENT PLACEMENT;  Surgeon: Vanna Scotland, MD;  Location: [facility] ORS;  Service: Urology;  Laterality: Right;  . Extracorporeal shock wave lithotripsy  2006    Home Medications:    Medication List    Notice  As of 07/19/2015  8:52 AM   You have not been prescribed any medications.      Allergies: No Known Allergies  Family History: Family History  Problem Relation Age of Onset  . Bladder Cancer Neg Hx   . Prostate cancer Neg Hx   . Kidney cancer Neg Hx     Social History:   reports that he has never smoked. He does not have any smokeless tobacco history on file. He reports that he does not drink alcohol or use illicit drugs.  ROS: UROLOGY Frequent Urination?: No Hard to postpone urination?: No Burning/pain with urination?: No Get up at night to urinate?: No Leakage of urine?: No Urine stream starts and stops?: No Trouble starting stream?: No Do you have to strain to urinate?: No Blood in urine?: No Urinary tract infection?: No Sexually transmitted disease?: No Injury to kidneys or bladder?: No Painful intercourse?: No Weak stream?: No Erection problems?: No Penile pain?: No  Gastrointestinal Nausea?: No Vomiting?: No Indigestion/heartburn?: No Diarrhea?: No Constipation?: No  Constitutional Fever: No Night sweats?: No Weight loss?: No Fatigue?: No  Skin Skin rash/lesions?: No Itching?: No  Eyes Blurred vision?: No Double vision?: No  Ears/Nose/Throat Sore throat?: No Sinus problems?: No  Hematologic/Lymphatic Swollen glands?: No Easy bruising?: No  Cardiovascular Leg swelling?: No Chest pain?: No  Respiratory Cough?: No Shortness of breath?: No  Endocrine Excessive thirst?: No  Musculoskeletal Back pain?: No Joint pain?: No  Neurological Headaches?: No Dizziness?: No  Psychologic Depression?: No Anxiety?: No  Physical Exam: BP 152/91 mmHg  Pulse 73  Ht 5\' 9"  (1.753 m)  Wt 212 lb (96.163 kg)  BMI 31.29 kg/m2  Constitutional:  Alert and oriented, No acute distress. HEENT: [place] AT, moist mucus membranes.  Trachea midline, no masses. Cardiovascular: No clubbing, cyanosis, or edema. RRR. Respiratory: Normal  respiratory effort, no increased work of breathing.  CTAB. GI: Abdomen is soft, nontender, nondistended, no abdominal masses GU: No CVA tenderness.  Skin: No rashes, bruises or suspicious lesions. Neurologic: Grossly intact, no focal deficits, moving all 4 extremities. Psychiatric: Normal mood and  affect.  Laboratory Data: Lab Results  Component Value Date   WBC 13.7* 06/14/2015   HGB 15.7 06/14/2015   HCT 45.7 06/14/2015   MCV 82.2 06/14/2015   PLT 240 06/14/2015    Lab Results  Component Value Date   CREATININE 1.31* 06/14/2015    Urinalysis    Component Value Date/Time   COLORURINE YELLOW* 06/14/2015 0446   APPEARANCEUR HAZY* 06/14/2015 0446   LABSPEC 1.020 06/14/2015 0446   PHURINE 6.0 06/14/2015 0446   GLUCOSEU NEGATIVE 06/14/2015 0446   HGBUR 3+* 06/14/2015 0446   BILIRUBINUR NEGATIVE 06/14/2015 0446   KETONESUR NEGATIVE 06/14/2015 0446   PROTEINUR NEGATIVE 06/14/2015 0446   NITRITE NEGATIVE 06/14/2015 0446   LEUKOCYTESUR NEGATIVE 06/14/2015 0446    Pertinent Imaging: Study Result     CLINICAL DATA: Right flank pain.  EXAM: CT ABDOMEN AND PELVIS WITHOUT CONTRAST  TECHNIQUE: Multidetector CT imaging of the abdomen and pelvis was performed following the standard protocol without IV contrast.  COMPARISON: 03/11/2007  FINDINGS: There are multiple renal calculi bilaterally. On the right there are at least 11 in number, measuring up to 7.5 mm. On the left there are at least 4 calculi, measuring up to 5 mm. There are 3 right ureteral calculi. At the ureteropelvic junction there is a nonobstructing 6 mm calculus. At the low L3 level there is a 5 x 7 mm proximal right ureteral calculus. In the distal right ureter there is another 5 x 7 mm calculus, 1.5 cm from the ureterovesical junction. There is marked hydroureter and hydronephrosis on the right. There are no left ureteral, no significant renal parenchymal lesions are evident on this unenhanced scan.  There are unremarkable unenhanced appearances of the liver, gallbladder, bile ducts, spleen, pancreas and adrenals. The abdominal aorta is normal in caliber. There is no atherosclerotic calcification. There is no adenopathy in the abdomen or pelvis. There are normal appearances of the stomach,  small bowel and colon. The appendix is normal.  Mild atelectatic appearing posterior lung base opacities are present. There is a noncalcified 7 mm nodule in the posterior lateral periphery of the left lower lobe. This is unchanged from 03/11/2007 and is benign.  There is no significant skeletal lesion.  IMPRESSION: 1. Three right ureteral calculi, including a 5 x 7 mm distal ureteral calculus 1.5 cm above the UVJ, a 5 x 7 mm proximal ureteral calculus at the low L3 level, and a 6 mm nonobstructing calculus at the ureteropelvic junction. Marked hydronephrosis and hydroureter. 2. Bilateral nephrolithiasis. 3. Benign 7 mm nodule in the posterolateral periphery of the left lower lobe, not requiring additional evaluation.   Electronically Signed  By: Ellery Plunkaniel R Mitchell M.D.  On: 06/14/2015 03:25   Study Result     CLINICAL DATA: Bilateral kidney stones, status post right ureteral stent on May 2017  EXAM: RENAL / URINARY TRACT ULTRASOUND COMPLETE  COMPARISON: CT abdomen pelvis dated 06/14/2015  FINDINGS: Right Kidney:  Length: 11.3 cm. 11 x 5 x 8 mm interpolar calculus. No hydronephrosis.  Left Kidney:  Length: 11.9 cm. 10 x 7 x 9 mm interpolar calculus. No hydronephrosis.  Bladder:  Underdistended but unremarkable.  IMPRESSION: 11 mm interpolar right renal calculus.  10 mm interpolar left renal calculus.  No hydronephrosis.   Electronically Signed  By: Charline Bills M.D.  On: 07/12/2015 14:05   CT scan and RUS reviewed personally today.  Assessment & Plan:    1. Kidney stone Status post right ureteroscopy for 3 large obstructing ureteral stones. He has numerous bilateral nonobstructing stones, R>L up to ~8 mm.  Discussed options moving forward including continued observation of his stones versus ureteroscopy to clear him of the stone burden. I would recommend intervention of the right side given the size of his largest stone.  We would be able to hopefully treat all of the stones on the side in one setting. There are only proximally 2-3 smaller stones on the left and observation may be appropriate.  Given his stone composition and multiple stones, I would recommend ureteroscopy on the right.  Risks and benefits of ureteroscopy were reviewed including but not limited to infection, bleeding, pain, ureteral injury which could require open surgery versus prolonged indwelling if ureteralperforation occurs, persistent stone disease, requirement for staged procedure, possible stent, and global anesthesia risks. Patient expressed understanding and desires to proceed with ureteroscopy.   He will also need a 24-hour urine metabolic workup following his upcoming procedure. He would like to have all of the stone by August.  - Urinalysis, Complete -UCx (preop)  Schedule R URS, LL, stent   Vanna Scotland, MD  [facility] [address], [address] [place], [place] [address] [phone]

## 2015-07-20 ENCOUNTER — Other Ambulatory Visit: Payer: No Typology Code available for payment source

## 2015-07-20 ENCOUNTER — Telehealth: Payer: Self-pay | Admitting: Radiology

## 2015-07-20 ENCOUNTER — Encounter: Payer: Self-pay | Admitting: *Deleted

## 2015-07-20 NOTE — Patient Instructions (Signed)
  Your procedure is scheduled on: 07/24/15 Report to Day Surgery. [facility]OND FLOOR To find out your arrival time please call [phone] between 1PM - 3PM on 07/21/15  Remember: Instructions that are not followed completely may result in serious medical risk, up to and including death, or upon the discretion of your surgeon and anesthesiologist your surgery may need to be rescheduled.    __X__ 1. Do not eat food or drink liquids after midnight. No gum chewing or hard candies.     __X__ 2. No Alcohol for 24 hours before or after surgery.   _X___ 3. Do Not Smoke For 24 Hours Prior to Your Surgery.   ____ 4. Bring all medications with you on the day of surgery if instructed.    __X__ 5. Notify your doctor if there is any change in your medical condition     (cold, fever, infections).       Do not wear jewelry, make-up, hairpins, clips or nail polish.  Do not wear lotions, powders, or perfumes. You may wear deodorant.  Do not shave 48 hours prior to surgery. Men may shave face and neck.  Do not bring valuables to the hospital.    [facility] for any belongings or valuables.               Contacts, dentures or bridgework may not be worn into surgery.  Leave your suitcase in the car. After surgery it may be brought to your room.  For patients admitted to the hospital, discharge time is determined by your                treatment team.   Patients discharged the day of surgery will not be allowed to drive home.   Please read over the following fact sheets that you were given:   Surgical Site Infection Prevention   ____ Take these medicines the morning of surgery with A SIP OF WATER:    1. NONE  2.   3.   4.  5.  6.  ____ Fleet Enema (as directed)   ____ Use CHG Soap as directed  ____ Use inhalers on the day of surgery  ____ Stop metformin 2 days prior to surgery    ____ Take 1/2 of usual insulin dose the night before surgery and none on the  morning of surgery.   ____ Stop Coumadin/Plavix/aspirin on  ____ Stop Anti-inflammatories on   ____ Stop supplements until after surgery.    ____ Bring C-Pap to the hospital.

## 2015-07-20 NOTE — Telephone Encounter (Signed)
Notified pt of surgery scheduled 07/24/15, pre-admit phone interview on 07/20/15 between 1-5pm & to call Friday prior to surgery for arrival time to SDS. Pt voices understanding.

## 2015-07-21 LAB — CULTURE, URINE COMPREHENSIVE

## 2015-07-24 ENCOUNTER — Encounter
Admission: RE | Disposition: A | Discharge status: Home / Self Care | Payer: Self-pay | Source: Ambulatory Visit | Attending: Urology

## 2015-07-24 ENCOUNTER — Ambulatory Visit: Payer: PRIVATE HEALTH INSURANCE | Admitting: Anesthesiology

## 2015-07-24 ENCOUNTER — Encounter: Payer: Self-pay | Admitting: *Deleted

## 2015-07-24 ENCOUNTER — Ambulatory Visit
Admission: RE | Admit: 2015-07-24 | Discharge: 2015-07-24 | Disposition: A | Discharge status: Home / Self Care | Payer: PRIVATE HEALTH INSURANCE | Source: Ambulatory Visit | Attending: Urology | Admitting: Urology

## 2015-07-24 DIAGNOSIS — Z87442 Personal history of urinary calculi: Secondary | ICD-10-CM | POA: Insufficient documentation

## 2015-07-24 DIAGNOSIS — N200 Calculus of kidney: Secondary | ICD-10-CM

## 2015-07-24 DIAGNOSIS — N132 Hydronephrosis with renal and ureteral calculous obstruction: Secondary | ICD-10-CM | POA: Insufficient documentation

## 2015-07-24 DIAGNOSIS — Z9889 Other specified postprocedural states: Secondary | ICD-10-CM | POA: Diagnosis not present

## 2015-07-24 HISTORY — PX: CYSTOSCOPY WITH STENT PLACEMENT: SHX5790

## 2015-07-24 HISTORY — PX: URETEROSCOPY WITH HOLMIUM LASER LITHOTRIPSY: SHX6645

## 2015-07-24 SURGERY — URETEROSCOPY, WITH LITHOTRIPSY USING HOLMIUM LASER
Anesthesia: General | Laterality: Right | Wound class: Clean Contaminated

## 2015-07-24 MED ORDER — SUCCINYLCHOLINE CHLORIDE 20 MG/ML IJ SOLN
INTRAMUSCULAR | Status: DC | PRN
  Administered 2015-07-24: 100 mg via INTRAVENOUS

## 2015-07-24 MED ORDER — ONDANSETRON HCL 4 MG/2ML IJ SOLN
4.0000 mg | Freq: Once | INTRAMUSCULAR | Status: DC | PRN
Start: 2015-07-24 — End: 2015-07-24

## 2015-07-24 MED ORDER — IOTHALAMATE MEGLUMINE 43 % IV SOLN
INTRAVENOUS | Status: DC | PRN
  Administered 2015-07-24: 20 mL

## 2015-07-24 MED ORDER — TAMSULOSIN HCL 0.4 MG PO CAPS: 0.4000 mg | ORAL_CAPSULE | Freq: Every day | ORAL | Status: DC

## 2015-07-24 MED ORDER — FENTANYL CITRATE (PF) 100 MCG/2ML IJ SOLN: 25.0000 ug | INTRAMUSCULAR | Status: DC | PRN

## 2015-07-24 MED ORDER — PROPOFOL 10 MG/ML IV BOLUS
INTRAVENOUS | Status: DC | PRN
  Administered 2015-07-24: 200 mg via INTRAVENOUS

## 2015-07-24 MED ORDER — MIDAZOLAM HCL 2 MG/2ML IJ SOLN
INTRAMUSCULAR | Status: DC | PRN
  Administered 2015-07-24: 2 mg via INTRAVENOUS

## 2015-07-24 MED ORDER — CEFAZOLIN SODIUM-DEXTROSE 2-4 GM/100ML-% IV SOLN
2.0000 g | Freq: Once | INTRAVENOUS | Status: AC
  Administered 2015-07-24: 2 g via INTRAVENOUS

## 2015-07-24 MED ORDER — FAMOTIDINE 20 MG PO TABS
ORAL_TABLET | ORAL | Status: AC
  Filled 2015-07-24: qty 1

## 2015-07-24 MED ORDER — HYDROCODONE-ACETAMINOPHEN 5-325 MG PO TABS: 1.0000 | ORAL_TABLET | Freq: Four times a day (QID) | ORAL | Status: DC | PRN

## 2015-07-24 MED ORDER — FENTANYL CITRATE (PF) 100 MCG/2ML IJ SOLN
INTRAMUSCULAR | Status: DC | PRN
  Administered 2015-07-24: 100 ug via INTRAVENOUS

## 2015-07-24 MED ORDER — DEXAMETHASONE SODIUM PHOSPHATE 10 MG/ML IJ SOLN
INTRAMUSCULAR | Status: DC | PRN
  Administered 2015-07-24: 10 mg via INTRAVENOUS

## 2015-07-24 MED ORDER — ONDANSETRON HCL 4 MG/2ML IJ SOLN
INTRAMUSCULAR | Status: DC | PRN
  Administered 2015-07-24: 4 mg via INTRAVENOUS

## 2015-07-24 MED ORDER — KETOROLAC TROMETHAMINE 30 MG/ML IJ SOLN
INTRAMUSCULAR | Status: DC | PRN
  Administered 2015-07-24: 30 mg via INTRAVENOUS

## 2015-07-24 MED ORDER — DOCUSATE SODIUM 100 MG PO CAPS: 100.0000 mg | ORAL_CAPSULE | Freq: Two times a day (BID) | ORAL | Status: DC

## 2015-07-24 MED ORDER — ROCURONIUM BROMIDE 100 MG/10ML IV SOLN
INTRAVENOUS | Status: DC | PRN
  Administered 2015-07-24: 20 mg via INTRAVENOUS

## 2015-07-24 MED ORDER — SUGAMMADEX SODIUM 200 MG/2ML IV SOLN
INTRAVENOUS | Status: DC | PRN
  Administered 2015-07-24: 192.4 mg via INTRAVENOUS

## 2015-07-24 MED ORDER — CEFAZOLIN SODIUM-DEXTROSE 2-4 GM/100ML-% IV SOLN
INTRAVENOUS | Status: AC
  Filled 2015-07-24: qty 100

## 2015-07-24 MED ORDER — OXYBUTYNIN CHLORIDE 5 MG PO TABS: 5.0000 mg | ORAL_TABLET | Freq: Three times a day (TID) | ORAL | Status: DC | PRN

## 2015-07-24 MED ORDER — FAMOTIDINE 20 MG PO TABS
20.0000 mg | ORAL_TABLET | Freq: Once | ORAL | Status: AC
  Administered 2015-07-24: 20 mg via ORAL

## 2015-07-24 MED ORDER — LIDOCAINE HCL (CARDIAC) 20 MG/ML IV SOLN
INTRAVENOUS | Status: DC | PRN
  Administered 2015-07-24: 100 mg via INTRAVENOUS

## 2015-07-24 MED ORDER — LACTATED RINGERS IV SOLN
INTRAVENOUS | Status: DC
  Administered 2015-07-24: 10:00:00 via INTRAVENOUS

## 2015-07-24 SURGICAL SUPPLY — 35 items
ADAPTER SCOPE UROLOK II (MISCELLANEOUS) ×3 IMPLANT
ADHESIVE MASTISOL STRL (MISCELLANEOUS) ×3 IMPLANT
BAG DRAIN CYSTO-URO LG1000N (MISCELLANEOUS) ×3 IMPLANT
BASKET ZERO TIP 1.9FR (BASKET) ×3 IMPLANT
CATH FOL 2WAY LX 16X5 (CATHETERS) IMPLANT
CATH URETL 5X70 OPEN END (CATHETERS) ×3 IMPLANT
CNTNR SPEC 2.5X3XGRAD LEK (MISCELLANEOUS)
CONRAY 43 FOR UROLOGY 50M (MISCELLANEOUS) ×3 IMPLANT
CONT SPEC 4OZ STER OR WHT (MISCELLANEOUS)
CONTAINER SPEC 2.5X3XGRAD LEK (MISCELLANEOUS) IMPLANT
DRAPE UTILITY 15X26 TOWEL STRL (DRAPES) ×3 IMPLANT
DRSG TEGADERM 2-3/8X2-3/4 SM (GAUZE/BANDAGES/DRESSINGS) ×3 IMPLANT
FEE TECHNICIAN ONLY PER HOUR (MISCELLANEOUS) IMPLANT
GLOVE BIO SURGEON STRL SZ 6.5 (GLOVE) ×2 IMPLANT
GLOVE BIO SURGEONS STRL SZ 6.5 (GLOVE) ×1
GOWN STRL REUS W/ TWL LRG LVL4 (GOWN DISPOSABLE) ×2 IMPLANT
GOWN STRL REUS W/TWL LRG LVL4 (GOWN DISPOSABLE) ×4
HOLDER FOLEY CATH W/STRAP (MISCELLANEOUS) IMPLANT
INTRODUCER DILATOR DOUBLE (INTRODUCER) ×3 IMPLANT
KIT RM TURNOVER CYSTO AR (KITS) ×3 IMPLANT
LASER FIBER 200M SMARTSCOPE (Laser) ×3 IMPLANT
PACK CYSTO AR (MISCELLANEOUS) ×3 IMPLANT
PREP PVP WINGED SPONGE (MISCELLANEOUS) IMPLANT
PUMP SINGLE ACTION SAP (PUMP) IMPLANT
SENSORWIRE 0.038 NOT ANGLED (WIRE) ×3
SET CYSTO W/LG BORE CLAMP LF (SET/KITS/TRAYS/PACK) ×3 IMPLANT
SHEATH URETERAL 12FRX35CM (MISCELLANEOUS) ×3 IMPLANT
SOL .9 NS 3000ML IRR  AL (IV SOLUTION) ×2
SOL .9 NS 3000ML IRR UROMATIC (IV SOLUTION) ×1 IMPLANT
STENT URET 6FRX24 CONTOUR (STENTS) IMPLANT
STENT URET 6FRX26 CONTOUR (STENTS) ×3 IMPLANT
SURGILUBE 2OZ TUBE FLIPTOP (MISCELLANEOUS) ×3 IMPLANT
SYRINGE IRR TOOMEY STRL 70CC (SYRINGE) ×3 IMPLANT
WATER STERILE IRR 1000ML POUR (IV SOLUTION) ×3 IMPLANT
WIRE SENSOR 0.038 NOT ANGLED (WIRE) ×1 IMPLANT

## 2015-07-24 NOTE — Anesthesia Postprocedure Evaluation (Signed)
Anesthesia Post Note  Patient: Boneta LucksJeremy D Sinkler  Procedure(s) Performed: Procedure(s) (LRB): URETEROSCOPY WITH HOLMIUM LASER LITHOTRIPSY (Right) CYSTOSCOPY WITH STENT PLACEMENT (Right)  Patient location during evaluation: PACU Anesthesia Type: General Level of consciousness: awake and alert Pain management: pain level controlled Vital Signs Assessment: post-procedure vital signs reviewed and stable Respiratory status: spontaneous breathing, nonlabored ventilation, respiratory function stable and patient connected to nasal cannula oxygen Cardiovascular status: blood pressure returned to baseline and stable Postop Assessment: no signs of nausea or vomiting Anesthetic complications: no    Last Vitals:  Filed Vitals:   07/24/15 1217 07/24/15 1241  BP: 137/91 125/91  Pulse: 69 76  Temp: 35.9 C   Resp: 16     Last Pain:  Filed Vitals:   07/24/15 1242  PainSc: 0-No pain                 Marshawn Ninneman S

## 2015-07-24 NOTE — H&P (View-Only) (Signed)
07/19/2015 8:52 AM   Boneta Lucks November 25, 1980 [id number]  Referring provider: No referring provider defined for this encounter.  Chief Complaint  Patient presents with  . Nephrolithiasis    New Patient  . Results    4wk u/s    HPI: 35 year old male who was admitted for pain control on 06/14/15 who ultimately underwent with 3 - 7 mm right ureteral stones who underwent right URS, LL, stent.  He was discharged home without any further complications. He has since removed his own stent. Follow-up renal ultrasound shows no evidence of residual hydronephrosis.  He does have bilateral nonobstructing stones, right greater than left up to 8 mm.    He has passed stones in the past, most recently 4 years ago. He has seen [facility] around that time. He is also had ESWL times one which he reports was not successful.  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  Stone analysis shows stone composition calcium oxalate monohydrate 97%, calcium phosphate 3%.  No urinary symptoms today.  He denies any dysuria or gross hematuria. No episodes of recent flank pain since the stent was removed.  PMH: Past Medical History  Diagnosis Date  . Nephrolithiasis     Surgical History: Past Surgical History  Procedure Laterality Date  . Cystoscopy/ureteroscopy/holmium laser/stent placement Right 06/14/2015    Procedure: CYSTOSCOPY/  URETEROSCOPY /HOLMIUM LASERLITHOTRIPSY / STENT PLACEMENT;  Surgeon: Vanna Scotland, MD;  Location: [facility] ORS;  Service: Urology;  Laterality: Right;  . Extracorporeal shock wave lithotripsy  2006    Home Medications:    Medication List    Notice  As of 07/19/2015  8:52 AM   You have not been prescribed any medications.      Allergies: No Known Allergies  Family History: Family History  Problem Relation Age of Onset  . Bladder Cancer Neg Hx   . Prostate cancer Neg Hx   . Kidney cancer Neg Hx     Social History:   reports that he has never smoked. He does not have any smokeless tobacco history on file. He reports that he does not drink alcohol or use illicit drugs.  ROS: UROLOGY Frequent Urination?: No Hard to postpone urination?: No Burning/pain with urination?: No Get up at night to urinate?: No Leakage of urine?: No Urine stream starts and stops?: No Trouble starting stream?: No Do you have to strain to urinate?: No Blood in urine?: No Urinary tract infection?: No Sexually transmitted disease?: No Injury to kidneys or bladder?: No Painful intercourse?: No Weak stream?: No Erection problems?: No Penile pain?: No  Gastrointestinal Nausea?: No Vomiting?: No Indigestion/heartburn?: No Diarrhea?: No Constipation?: No  Constitutional Fever: No Night sweats?: No Weight loss?: No Fatigue?: No  Skin Skin rash/lesions?: No Itching?: No  Eyes Blurred vision?: No Double vision?: No  Ears/Nose/Throat Sore throat?: No Sinus problems?: No  Hematologic/Lymphatic Swollen glands?: No Easy bruising?: No  Cardiovascular Leg swelling?: No Chest pain?: No  Respiratory Cough?: No Shortness of breath?: No  Endocrine Excessive thirst?: No  Musculoskeletal Back pain?: No Joint pain?: No  Neurological Headaches?: No Dizziness?: No  Psychologic Depression?: No Anxiety?: No  Physical Exam: BP 152/91 mmHg  Pulse 73  Ht 5\' 9"  (1.753 m)  Wt 212 lb (96.163 kg)  BMI 31.29 kg/m2  Constitutional:  Alert and oriented, No acute distress. HEENT: [place] AT, moist mucus membranes.  Trachea midline, no masses. Cardiovascular: No clubbing, cyanosis, or edema. RRR. Respiratory: Normal  respiratory effort, no increased work of breathing.  CTAB. GI: Abdomen is soft, nontender, nondistended, no abdominal masses GU: No CVA tenderness.  Skin: No rashes, bruises or suspicious lesions. Neurologic: Grossly intact, no focal deficits, moving all 4 extremities. Psychiatric: Normal mood and  affect.  Laboratory Data: Lab Results  Component Value Date   WBC 13.7* 06/14/2015   HGB 15.7 06/14/2015   HCT 45.7 06/14/2015   MCV 82.2 06/14/2015   PLT 240 06/14/2015    Lab Results  Component Value Date   CREATININE 1.31* 06/14/2015    Urinalysis    Component Value Date/Time   COLORURINE YELLOW* 06/14/2015 0446   APPEARANCEUR HAZY* 06/14/2015 0446   LABSPEC 1.020 06/14/2015 0446   PHURINE 6.0 06/14/2015 0446   GLUCOSEU NEGATIVE 06/14/2015 0446   HGBUR 3+* 06/14/2015 0446   BILIRUBINUR NEGATIVE 06/14/2015 0446   KETONESUR NEGATIVE 06/14/2015 0446   PROTEINUR NEGATIVE 06/14/2015 0446   NITRITE NEGATIVE 06/14/2015 0446   LEUKOCYTESUR NEGATIVE 06/14/2015 0446    Pertinent Imaging: Study Result     CLINICAL DATA: Right flank pain.  EXAM: CT ABDOMEN AND PELVIS WITHOUT CONTRAST  TECHNIQUE: Multidetector CT imaging of the abdomen and pelvis was performed following the standard protocol without IV contrast.  COMPARISON: 03/11/2007  FINDINGS: There are multiple renal calculi bilaterally. On the right there are at least 11 in number, measuring up to 7.5 mm. On the left there are at least 4 calculi, measuring up to 5 mm. There are 3 right ureteral calculi. At the ureteropelvic junction there is a nonobstructing 6 mm calculus. At the low L3 level there is a 5 x 7 mm proximal right ureteral calculus. In the distal right ureter there is another 5 x 7 mm calculus, 1.5 cm from the ureterovesical junction. There is marked hydroureter and hydronephrosis on the right. There are no left ureteral, no significant renal parenchymal lesions are evident on this unenhanced scan.  There are unremarkable unenhanced appearances of the liver, gallbladder, bile ducts, spleen, pancreas and adrenals. The abdominal aorta is normal in caliber. There is no atherosclerotic calcification. There is no adenopathy in the abdomen or pelvis. There are normal appearances of the stomach,  small bowel and colon. The appendix is normal.  Mild atelectatic appearing posterior lung base opacities are present. There is a noncalcified 7 mm nodule in the posterior lateral periphery of the left lower lobe. This is unchanged from 03/11/2007 and is benign.  There is no significant skeletal lesion.  IMPRESSION: 1. Three right ureteral calculi, including a 5 x 7 mm distal ureteral calculus 1.5 cm above the UVJ, a 5 x 7 mm proximal ureteral calculus at the low L3 level, and a 6 mm nonobstructing calculus at the ureteropelvic junction. Marked hydronephrosis and hydroureter. 2. Bilateral nephrolithiasis. 3. Benign 7 mm nodule in the posterolateral periphery of the left lower lobe, not requiring additional evaluation.   Electronically Signed  By: Ellery Plunkaniel R Mitchell M.D.  On: 06/14/2015 03:25   Study Result     CLINICAL DATA: Bilateral kidney stones, status post right ureteral stent on May 2017  EXAM: RENAL / URINARY TRACT ULTRASOUND COMPLETE  COMPARISON: CT abdomen pelvis dated 06/14/2015  FINDINGS: Right Kidney:  Length: 11.3 cm. 11 x 5 x 8 mm interpolar calculus. No hydronephrosis.  Left Kidney:  Length: 11.9 cm. 10 x 7 x 9 mm interpolar calculus. No hydronephrosis.  Bladder:  Underdistended but unremarkable.  IMPRESSION: 11 mm interpolar right renal calculus.  10 mm interpolar left renal calculus.  No hydronephrosis.   Electronically Signed  By: Charline Bills M.D.  On: 07/12/2015 14:05   CT scan and RUS reviewed personally today.  Assessment & Plan:    1. Kidney stone Status post right ureteroscopy for 3 large obstructing ureteral stones. He has numerous bilateral nonobstructing stones, R>L up to ~8 mm.  Discussed options moving forward including continued observation of his stones versus ureteroscopy to clear him of the stone burden. I would recommend intervention of the right side given the size of his largest stone.  We would be able to hopefully treat all of the stones on the side in one setting. There are only proximally 2-3 smaller stones on the left and observation may be appropriate.  Given his stone composition and multiple stones, I would recommend ureteroscopy on the right.  Risks and benefits of ureteroscopy were reviewed including but not limited to infection, bleeding, pain, ureteral injury which could require open surgery versus prolonged indwelling if ureteralperforation occurs, persistent stone disease, requirement for staged procedure, possible stent, and global anesthesia risks. Patient expressed understanding and desires to proceed with ureteroscopy.   He will also need a 24-hour urine metabolic workup following his upcoming procedure. He would like to have all of the stone by August.  - Urinalysis, Complete -UCx (preop)  Schedule R URS, LL, stent   Vanna Scotland, MD  [facility] [address], [address] [place], [place] [address] [phone]

## 2015-07-24 NOTE — Anesthesia Preprocedure Evaluation (Addendum)
Anesthesia Evaluation  Patient identified by MRN, date of birth, ID band Patient awake    Reviewed: Allergy & Precautions, NPO status , Patient's Chart, lab work & pertinent test results, reviewed documented beta blocker date and time   Airway Mallampati: III  TM Distance: >3 FB     Dental  (+) Chipped   Pulmonary           Cardiovascular      Neuro/Psych    GI/Hepatic   Endo/Other    Renal/GU      Musculoskeletal   Abdominal   Peds  Hematology   Anesthesia Other Findings Obese.  Reproductive/Obstetrics                            Anesthesia Physical Anesthesia Plan  ASA: II  Anesthesia Plan: General   Post-op Pain Management:    Induction: Intravenous  Airway Management Planned: Oral ETT and LMA  Additional Equipment:   Intra-op Plan:   Post-operative Plan:   Informed Consent: I have reviewed the patients History and Physical, chart, labs and discussed the procedure including the risks, benefits and alternatives for the proposed anesthesia with the patient or authorized representative who has indicated his/her understanding and acceptance.     Plan Discussed with: CRNA  Anesthesia Plan Comments:         Anesthesia Quick Evaluation

## 2015-07-24 NOTE — Interval H&P Note (Signed)
History and Physical Interval Note:  07/24/2015 9:43 AM  [name]  has presented today for surgery, with the diagnosis of RIGHT KIDNEY STONES  The various methods of treatment have been discussed with the patient and family. After consideration of risks, benefits and other options for treatment, the patient has consented to  Procedure(s): URETEROSCOPY WITH HOLMIUM LASER LITHOTRIPSY (Right) CYSTOSCOPY WITH STENT PLACEMENT (Right) HOLMIUM LASER APPLICATION (Right) as a surgical intervention .  The patient's history has been reviewed, patient examined, no change in status, stable for surgery.  I have reviewed the patient's chart and labs.  Questions were answered to the patient's satisfaction.     Vanna ScotlandAshley Ruger Saxer

## 2015-07-24 NOTE — Anesthesia Procedure Notes (Signed)
Procedure Name: Intubation Date/Time: 07/24/2015 10:07 AM Performed by: Stormy FabianURTIS, Karis Emig Pre-anesthesia Checklist: Patient identified, Patient being monitored, Timeout performed, Emergency Drugs available and Suction available Patient Re-evaluated:Patient Re-evaluated prior to inductionOxygen Delivery Method: Circle system utilized Preoxygenation: Pre-oxygenation with 100% oxygen Intubation Type: IV induction Ventilation: Mask ventilation without difficulty Laryngoscope Size: Mac and 3 Grade View: Grade I Tube type: Oral Tube size: 7.5 mm Number of attempts: 1 Airway Equipment and Method: Stylet Placement Confirmation: ETT inserted through vocal cords under direct vision,  positive ETCO2 and breath sounds checked- equal and bilateral Secured at: 21 cm Tube secured with: Tape Dental Injury: Teeth and Oropharynx as per pre-operative assessment  Comments: Anterior Airway

## 2015-07-24 NOTE — Discharge Instructions (Signed)
You have a ureteral stent in place.  This is a tube that extends from your kidney to your bladder.  This may cause urinary bleeding, burning with urination, and urinary frequency.  Please call our office or present to the ED if you develop fevers >101 or pain which is not able to be controlled with oral pain medications.  You may be given either Flomax and/ or ditropan to help with bladder spasms and stent pain in addition to pain medications.    Your stent in on a string.  You may untape this and remove on Friday.  Try soaking in a bathtub to loosen the tape.  You have trouble, please call our office and we'll be glad to assist.  [facility] [address], [address] [place], [place] [address] [phone] AMBULATORY SURGERY  DISCHARGE INSTRUCTIONS   1) The drugs that you were given will stay in your system until tomorrow so for the next 24 hours you should not:  A) Drive an automobile B) Make any legal decisions C) Drink any alcoholic beverage   2) You may resume regular meals tomorrow.  Today it is better to start with liquids and gradually work up to solid foods.  You may eat anything you prefer, but it is better to start with liquids, then soup and crackers, and gradually work up to solid foods.   3) Please notify your doctor immediately if you have any unusual bleeding, trouble breathing, redness and pain at the surgery site, drainage, fever, or pain not relieved by medication.    4) Additional Instructions:        Please contact your physician with any problems or Same Day Surgery at [phone], Monday through Friday 6 am to 4 pm, or [place] at [facility] Main number at [phone].AMBULATORY SURGERY  DISCHARGE INSTRUCTIONS   5) The drugs that you were given will stay in your system until tomorrow so for the next 24 hours you should not:  D) Drive an automobile E) Make any legal decisions F) Drink any alcoholic beverage   6) You may  resume regular meals tomorrow.  Today it is better to start with liquids and gradually work up to solid foods.  You may eat anything you prefer, but it is better to start with liquids, then soup and crackers, and gradually work up to solid foods.   7) Please notify your doctor immediately if you have any unusual bleeding, trouble breathing, redness and pain at the surgery site, drainage, fever, or pain not relieved by medication.    8) Additional Instructions:        Please contact your physician with any problems or Same Day Surgery at [phone], Monday through Friday 6 am to 4 pm, or [place] at [facility] Main number at [phone].AMBULATORY SURGERY  DISCHARGE INSTRUCTIONS   9) The drugs that you were given will stay in your system until tomorrow so for the next 24 hours you should not:  G) Drive an automobile H) Make any legal decisions I) Drink any alcoholic beverage   10) You may resume regular meals tomorrow.  Today it is better to start with liquids and gradually work up to solid foods.  You may eat anything you prefer, but it is better to start with liquids, then soup and crackers, and gradually work up to solid foods.   11) Please notify your doctor immediately if you have any unusual bleeding, trouble breathing, redness and pain at the surgery site, drainage, fever, or  pain not relieved by medication.    12) Additional Instructions:        Please contact your physician with any problems or Same Day Surgery at [phone], Monday through Friday 6 am to 4 pm, or [place] at [facility] number at [phone].

## 2015-07-24 NOTE — Op Note (Signed)
Date of procedure: 07/24/2015  Preoperative diagnosis:  1. Bilateral nonobstructing nephrolithiasis   Postoperative diagnosis:  1. Same as above   Procedure: 1. Right ureteroscopy 2. Laser lithotripsy 3. Basket extraction of stone fragment 4. Right retrograde pyelogram 5. Right ureteral stent placement  Surgeon: Vanna ScotlandAshley Alrick Cubbage, MD  Anesthesia: General  Complications: None  Intraoperative findings: At least 10 nonobstructing right-sided stones obliterated and extracted via basket. The knee was endoscopically clear of stone at the end of the procedure.  EBL: Minimal  Specimens: Stone fragments not sent for analysis (previously sent)  Drains: 6 x 26 French double-J ureteral stent on right (string left in place)  Indication: Garrett Phillips is a 35 y.o. patient with bilateral nephrolithiasis, right greater than left. Garrett Phillips presents today for definitive treatment of his right-sided stone burden.  After reviewing the management options for treatment, Garrett Phillips elected to proceed with the above surgical procedure(s). We have discussed the potential benefits and risks of the procedure, side effects of the proposed treatment, the likelihood of the patient achieving the goals of the procedure, and any potential problems that might occur during the procedure or recuperation. Informed consent has been obtained.  Description of procedure:  The patient was taken to the operating room and general anesthesia was induced.  The patient was placed in the dorsal lithotomy position, prepped and draped in the usual sterile fashion, and preoperative antibiotics were administered. A preoperative time-out was performed.   A 21 French rigid cystoscopy scope was advanced per urethra into the bladder. Attention was turned to the right ureteral orifice which was cannulated using a 5 [place]French open-ended ureteral catheter. A gentle retropyelogram was performed revealing a delicate appearing ureter without any filling  defects and a decompressed upper tract collecting system. Prior to the retrograde, few small stone fragments could be seen at on the scout image. A sensor wire was then placed up to level of the kidney without difficulty. A dual lumen access sheath was used just within the distal ureter under fluoroscopic guidance to introduce the Super Stiff wire. The sensor wire was snapped in place as a safety wire. A Cook 12/14 access sheath was advanced over the Super Stiff wire up to the proximal ureter. The inner cannula was removed. A 8 French dual-lumen flexible Wolf ureteroscope was advanced up to level of the renal pelvis which have a formal pyeloscopy was performed. There were numerable small spherical stones, at least 10 in number in each calyx.  1.9 [place]French and a basket was used to extract the majority of the stones without difficulty given their small size and spherical shape. A few of the larger stones were fragmented using a 200  laser fiber and the settings of 0.2 J and 40 Hz.  The residual fragments were also then removed. Once the kidney was visibly clear of all stone burden, a second retropyelogram was performed to the level of the UPJ crating a roadmap. This was then used to ensure that each never calyx was directly visualizing clear stone burden. Once complete, the sheath was backed out of the ureter inspecting along the way. There was no ureteral injury or trauma noted or residual stone fragments. The safety wire was then backloaded over a rigid cystoscope. A 626 French double-J ureteral stent was advanced over the wire up to level of the renal pelvis. Wire was partially drawn until full coil was noted within the renal pelvis. Wire was then fully withdrawn until full coil was started within the bladder. The bladder  was then drained. The stent string was affixed to the patient's glans using Mastisol and Tegaderm. Garrett Phillips is cleaned and dried, repositioned the supine position, reversed anesthesia, taken to the  PACU in stable condition.   Plan: Patient will follow-up in 4 weeks with renal ultrasound prior. Garrett Phillips'll remove his own stent on Friday.  Vanna Scotland, M.D.

## 2015-07-24 NOTE — Transfer of Care (Signed)
Immediate Anesthesia Transfer of Care Note  Patient: Garrett Phillips  Procedure(s) Performed: Procedure(s): URETEROSCOPY WITH HOLMIUM LASER LITHOTRIPSY (Right) CYSTOSCOPY WITH STENT PLACEMENT (Right)  Patient Location: PACU  Anesthesia Type:General  Level of Consciousness: patient cooperative and lethargic  Airway & Oxygen Therapy: Patient Spontanous Breathing and Patient connected to face mask oxygen  Post-op Assessment: Report given to RN and Post -op Vital signs reviewed and stable  Post vital signs: Reviewed and stable  Last Vitals:  Filed Vitals:   07/24/15 0912 07/24/15 1113  BP: 137/94 138/88  Pulse: 67   Temp: 36.6 C   Resp: 18 18    Last Pain: There were no vitals filed for this visit.       Complications: No apparent anesthesia complications

## 2015-08-30 ENCOUNTER — Ambulatory Visit
Admission: RE | Admit: 2015-08-30 | Discharge: 2015-08-30 | Disposition: A | Discharge status: Home / Self Care | Payer: PRIVATE HEALTH INSURANCE | Source: Ambulatory Visit | Attending: Urology | Admitting: Urology

## 2015-08-30 DIAGNOSIS — N200 Calculus of kidney: Secondary | ICD-10-CM | POA: Diagnosis present

## 2015-08-31 ENCOUNTER — Ambulatory Visit (INDEPENDENT_AMBULATORY_CARE_PROVIDER_SITE_OTHER): Payer: No Typology Code available for payment source | Admitting: Urology

## 2015-08-31 ENCOUNTER — Encounter: Payer: Self-pay | Admitting: Urology

## 2015-08-31 VITALS — BP 125/84 | HR 49 | Ht 69.0 in | Wt 213.9 lb

## 2015-08-31 DIAGNOSIS — N200 Calculus of kidney: Secondary | ICD-10-CM | POA: Diagnosis not present

## 2015-08-31 NOTE — Progress Notes (Signed)
08/31/2015 9:05 AM   Boneta Lucks 02-03-1981 [id number]  Referring provider: No referring provider defined for this encounter.  Chief Complaint  Patient presents with  . Follow-up    RUS results    HPI: 35 year old male With bilateral nephrolithiasis, right greater than left who initially presented with multiple obstructing right ureteral stones. He underwent a staged procedure initially on 06/14/2015 to address his multiple obstructing ureteral stones followed by staged right ureteroscopy on 07/24/2015 to treat the remainder residual stones on his right side.  The stent was left on a string and is able to remove it on his own.  Follow-up renal ultrasound shows no hydronephrosis bilaterally. There is a question of a nonobstructing small right calyceal stone and as well as a 12 mm left-sided stone.  He has passed stones in the past, most recently 4 years ago. He has seen [facility] around that time. He is also had ESWL times one which he reports was not successful.  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  Since surgery, he has really increased his water intake and watched soda/ teas.    Stone analysis shows stone composition calcium oxalate monohydrate 97%, calcium phosphate 3%.  No urinary symptoms today.  He denies any dysuria or gross hematuria. No episodes of recent flank pain since the stent was removed.  PMH: Past Medical History:  Diagnosis Date  . Nephrolithiasis     Surgical History: Past Surgical History:  Procedure Laterality Date  . CYSTOSCOPY WITH STENT PLACEMENT Right 07/24/2015   Procedure: CYSTOSCOPY WITH STENT PLACEMENT;  Surgeon: Vanna Scotland, MD;  Location: [facility] ORS;  Service: Urology;  Laterality: Right;  . CYSTOSCOPY/URETEROSCOPY/HOLMIUM LASER/STENT PLACEMENT Right 06/14/2015   Procedure: CYSTOSCOPY/  URETEROSCOPY /HOLMIUM LASERLITHOTRIPSY / STENT PLACEMENT;  Surgeon: Vanna Scotland, MD;  Location: [facility]  ORS;  Service: Urology;  Laterality: Right;  . EXTRACORPOREAL SHOCK WAVE LITHOTRIPSY  2006  . URETEROSCOPY WITH HOLMIUM LASER LITHOTRIPSY Right 07/24/2015   Procedure: URETEROSCOPY WITH HOLMIUM LASER LITHOTRIPSY;  Surgeon: Vanna Scotland, MD;  Location: [facility] ORS;  Service: Urology;  Laterality: Right;    Home Medications:    Medication List       Accurate as of 08/31/15  9:05 AM. Always use your most recent med list.          docusate sodium 100 MG capsule Commonly known as:  COLACE Take 1 capsule (100 mg total) by mouth 2 (two) times daily.   HYDROcodone-acetaminophen 5-325 MG tablet Commonly known as:  NORCO/VICODIN Take 1-2 tablets by mouth every 6 (six) hours as needed for moderate pain.   oxybutynin 5 MG tablet Commonly known as:  DITROPAN Take 1 tablet (5 mg total) by mouth every 8 (eight) hours as needed for bladder spasms.   tamsulosin 0.4 MG Caps capsule Commonly known as:  FLOMAX Take 1 capsule (0.4 mg total) by mouth daily.       Allergies: No Known Allergies  Family History: Family History  Problem Relation Age of Onset  . Bladder Cancer Neg Hx   . Prostate cancer Neg Hx   . Kidney cancer Neg Hx     Social History:  reports that he has never smoked. He has never used smokeless tobacco. He reports that he does not drink alcohol or use drugs.  ROS: UROLOGY Frequent Urination?: No Hard to postpone urination?: No Burning/pain with urination?: No Get up at night to urinate?: No Leakage of urine?: No Urine stream  starts and stops?: No Trouble starting stream?: No Do you have to strain to urinate?: No Blood in urine?: No Urinary tract infection?: No Sexually transmitted disease?: No Injury to kidneys or bladder?: No Painful intercourse?: No Weak stream?: No Erection problems?: No Penile pain?: No  Gastrointestinal Nausea?: No Vomiting?: No Indigestion/heartburn?: No Diarrhea?: No Constipation?: No  Constitutional Fever: No Night sweats?:  No Weight loss?: No Fatigue?: No  Skin Skin rash/lesions?: No Itching?: No  Eyes Blurred vision?: No Double vision?: No  Ears/Nose/Throat Sore throat?: No Sinus problems?: No  Hematologic/Lymphatic Swollen glands?: No Easy bruising?: No  Cardiovascular Leg swelling?: No Chest pain?: No  Respiratory Cough?: No Shortness of breath?: No  Endocrine Excessive thirst?: No  Musculoskeletal Back pain?: No Joint pain?: No  Neurological Headaches?: No Dizziness?: No  Psychologic Depression?: No Anxiety?: No  Physical Exam: BP 125/84 (BP Location: Left Arm, Patient Position: Sitting, Cuff Size: Large)   Pulse (!) 49   Ht 5\' 9"  (1.753 m)   Wt 213 lb 14.4 oz (97 kg)   BMI 31.59 kg/m   Constitutional:  Alert and oriented, No acute distress. HEENT: [place] AT, moist mucus membranes.  Trachea midline, no masses. Cardiovascular: No clubbing, cyanosis, or edema. Respiratory: Normal respiratory effort, no increased work of breathing.   GI: Abdomen is soft, nontender, nondistended, no abdominal masses GU: No CVA tenderness.  Skin: No rashes, bruises or suspicious lesions. Neurologic: Grossly intact, no focal deficits, moving all 4 extremities. Psychiatric: Normal mood and affect.  Laboratory Data: Lab Results  Component Value Date   WBC 13.7 (H) 06/14/2015   HGB 15.7 06/14/2015   HCT 45.7 06/14/2015   MCV 82.2 06/14/2015   PLT 240 06/14/2015    Lab Results  Component Value Date   CREATININE 1.31 (H) 06/14/2015    Pertinent Imaging: CLINICAL DATA:  Kidney stone. EXAM: RENAL / URINARY TRACT ULTRASOUND COMPLETE COMPARISON:  Ultrasound 07/12/2015.  CT 06/14/2015. FINDINGS: Right Kidney: Length: 11.6 cm. Echogenicity within normal limits. No mass or hydronephrosis visualized. Questionable nonobstructing 6 mm calyceal stone right knee kidney . Left Kidney: Length: 11.9 cm. Echogenicity within normal limits. No mass or hydronephrosis visualized. 12 mm  nonobstructing calyceal stone left mid kidney . Bladder: Appears normal for degree of bladder distention. IMPRESSION: 1. Questionable nonobstructing 6 mm calyceal stone right kidney. 2.  12 mm nonobstructing calyceal stone left mid kidney. 3. No hydronephrosis.  No bladder distention. Electronically Signed   By: Maisie Fus  Register   On: 08/31/2015 06:43  RUS and previous CT scan personally reviewed today  Assessment & Plan:    1. Kidney stone Status post right ureteroscopy for 3 large obstructing ureteral stones. He has numerous bilateral nonobstructing stones, R>L up to ~8 mm s/p stage R URS  Follow up RUS today shows possible residual right 6 mm calculus vs debris.  Left sided stone burden with 2 stones, ~6 mm and small nonobstructing stone.    Discussed options for management of left side.  He is most interested in observation at this time.  Recommend follow up in 6 months.    Given metabolically active stone formation bilaterally, recommend litholink analysis.  Instructions reviewed, will call with results.  We discussed general stone prevention techniques including drinking plenty water with goal of producing 2.5 L urine daily, increased citric acid intake, avoidance of high oxalate containing foods, and decreased salt intake.  Information about dietary recommendations given today.    Vanna Scotland, MD  [facility] [address],  [place], [place] [address] [phone]

## 2015-09-11 ENCOUNTER — Other Ambulatory Visit: Payer: No Typology Code available for payment source

## 2015-09-18 ENCOUNTER — Other Ambulatory Visit: Payer: Self-pay

## 2016-03-01 ENCOUNTER — Ambulatory Visit: Payer: No Typology Code available for payment source | Admitting: Urology

## 2016-03-22 ENCOUNTER — Ambulatory Visit (INDEPENDENT_AMBULATORY_CARE_PROVIDER_SITE_OTHER): Payer: PRIVATE HEALTH INSURANCE | Admitting: Urology

## 2016-03-22 ENCOUNTER — Ambulatory Visit
Admission: RE | Admit: 2016-03-22 | Discharge: 2016-03-22 | Disposition: A | Discharge status: Home / Self Care | Payer: PRIVATE HEALTH INSURANCE | Source: Ambulatory Visit | Attending: Urology | Admitting: Urology

## 2016-03-22 ENCOUNTER — Encounter: Payer: Self-pay | Admitting: Urology

## 2016-03-22 VITALS — BP 134/87 | HR 74 | Ht 69.0 in | Wt 211.0 lb

## 2016-03-22 DIAGNOSIS — I878 Other specified disorders of veins: Secondary | ICD-10-CM | POA: Insufficient documentation

## 2016-03-22 DIAGNOSIS — N200 Calculus of kidney: Secondary | ICD-10-CM | POA: Diagnosis present

## 2016-03-22 NOTE — Progress Notes (Signed)
03/22/2016 3:04 PM   Garrett Phillips 07/29/1980 [id number]  Referring provider: No referring provider defined for this encounter.  Chief Complaint  Patient presents with  . Nephrolithiasis    HPI: 36 year old male with history of bilateral nephrolithiasis who returns today for 6 month follow up.   He has a history of bilateral nephrolithiasis, right greater than left who initially presented with multiple obstructing right ureteral stones. He underwent a staged procedure initially on 06/14/2015 to address his multiple obstructing ureteral stones followed by staged right ureteroscopy on 07/24/2015 to treat the remainder residual stones on his right side.  No hydronephrosis or f/u renal ultrasound.    He has passed stones in the past, most recently 4 years ago. He has seen [facility] around that time. He is also had ESWL times one which he reports was not successful.  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  Since surgery, he has really increased his water intake and watched soda/ teas.     Stone analysis shows stone composition calcium oxalate monohydrate 97%, calcium phosphate 3%.  Litholink performed on 09/2015 shows very low urinary volume of only 1:.  His urinary calcium is borderline elevated at 223 mg/day.  He also has mild hyocitraturia, 436 mg/day.  SS CaOx 8.26.  Urinary pH 5.84.  Serum Cr 0.86, Ca 9.3, Co2 23.     No urinary symptoms today.  He denies any dysuria or gross hematuria. No episodes of recent flank pain since the stent was removed.  KUB today reviewed.  Stable left sided stone burden, small right non obstructing stone.    PMH: Past Medical History:  Diagnosis Date  . Nephrolithiasis     Surgical History: Past Surgical History:  Procedure Laterality Date  . CYSTOSCOPY WITH STENT PLACEMENT Right 07/24/2015   Procedure: CYSTOSCOPY WITH STENT PLACEMENT;  Surgeon: Vanna ScotlandAshley Katriona Schmierer, MD;  Location: [facility] ORS;  Service:  Urology;  Laterality: Right;  . CYSTOSCOPY/URETEROSCOPY/HOLMIUM LASER/STENT PLACEMENT Right 06/14/2015   Procedure: CYSTOSCOPY/  URETEROSCOPY /HOLMIUM LASERLITHOTRIPSY / STENT PLACEMENT;  Surgeon: Vanna ScotlandAshley Vincenzina Jagoda, MD;  Location: [facility];  Service: Urology;  Laterality: Right;  . EXTRACORPOREAL SHOCK WAVE LITHOTRIPSY  2006  . URETEROSCOPY WITH HOLMIUM LASER LITHOTRIPSY Right 07/24/2015   Procedure: URETEROSCOPY WITH HOLMIUM LASER LITHOTRIPSY;  Surgeon: Vanna ScotlandAshley Keveon Amsler, MD;  Location: [facility] ORS;  Service: Urology;  Laterality: Right;    Home Medications:  Allergies as of 03/22/2016   No Known Allergies     Medication List    as of 03/22/2016 11:59 PM   You have not been prescribed any medications.     Allergies: No Known Allergies  Family History: Family History  Problem Relation Age of Onset  . Bladder Cancer Neg Hx   . Prostate cancer Neg Hx   . Kidney cancer Neg Hx     Social History:  reports that he has never smoked. He has never used smokeless tobacco. He reports that he does not drink alcohol or use drugs.  ROS: UROLOGY Frequent Urination?: No Hard to postpone urination?: No Burning/pain with urination?: No Get up at night to urinate?: No Leakage of urine?: No Urine stream starts and stops?: No Trouble starting stream?: No Do you have to strain to urinate?: No Blood in urine?: No Urinary tract infection?: No Sexually transmitted disease?: No Injury to kidneys or bladder?: No Painful intercourse?: No Weak stream?: No Erection problems?: No Penile pain?: No  Gastrointestinal Nausea?: No Vomiting?: No Indigestion/heartburn?:  No Diarrhea?: No Constipation?: No  Constitutional Fever: No Night sweats?: No Weight loss?: No Fatigue?: No  Skin Skin rash/lesions?: No Itching?: No  Eyes Blurred vision?: No Double vision?: No  Ears/Nose/Throat Sore throat?: No Sinus problems?: No  Hematologic/Lymphatic Swollen glands?: No Easy bruising?:  No  Cardiovascular Leg swelling?: No Chest pain?: No  Respiratory Cough?: No Shortness of breath?: No  Endocrine Excessive thirst?: No  Musculoskeletal Back pain?: No Joint pain?: No  Neurological Headaches?: No Dizziness?: No  Psychologic Depression?: No Anxiety?: No  Physical Exam: BP 134/87 (BP Location: Left Arm, Patient Position: Sitting, Cuff Size: Normal)   Pulse 74   Ht 5\' 9"  (1.753 m)   Wt 211 lb (95.7 kg)   BMI 31.16 kg/m   Constitutional:  Alert and oriented, No acute distress. HEENT: [place] AT, moist mucus membranes.  Trachea midline, no masses. Cardiovascular: No clubbing, cyanosis, or edema. Respiratory: Normal respiratory effort, no increased work of breathing.   GI: Abdomen is soft, nontender, nondistended, no abdominal masses GU: No CVA tenderness.  Skin: No rashes, bruises or suspicious lesions. Neurologic: Grossly intact, no focal deficits, moving all 4 extremities. Psychiatric: Normal mood and affect.  Laboratory Data: Lab Results  Component Value Date   WBC 13.7 (H) 06/14/2015   HGB 15.7 06/14/2015   HCT 45.7 06/14/2015   MCV 82.2 06/14/2015   PLT 240 06/14/2015    Lab Results  Component Value Date   CREATININE 1.31 (H) 06/14/2015    Pertinent Imaging: CLINICAL DATA:  Follow-up right nephrolithiasis, lithotripsy 07/14/2015  EXAM: ABDOMEN - 1 VIEW  COMPARISON:  None.  FINDINGS: There is normal small bowel gas pattern. Suboptimal study due to abundant stool and some colonic gas obscuring the kidneys. There is calcification in upper pole region of the right kidney measures 4.6 mm. Calcification in upper pole region of the left kidney measures 5 mm. Calcification in lower pole region of the left kidney measures 2.7 mm. No definite ureteral calculi are identified. Pelvic phleboliths are noted.  IMPRESSION: 1. Bilateral nephrolithiasis as described above. No definite ureteral calculi are noted. Suboptimal study with abundant  stool and some colonic gas in right colon and transverse colon obscuring the kidneys. Pelvic phleboliths are noted.   Electronically Signed   By: Natasha Mead M.D.   On: 03/22/2016 10:59  KUB today personally reviewed  Assessment & Plan:    1. Kidney stone Status post right ureteroscopy for 3 large obstructing ureteral stones. He has numerous bilateral nonobstructing stones, R>L up to ~8 mm s/p stage R URS 06/2015  KUB today with ~ 5 mm nonobstructing right sided stone and stable left-sided nonobstructing stone burden. Compared to previous CT, left side is stable.  LitholinLink results were reviewed today in detail. Primarily, advised to dramatically increase fluid intake as primary intervention (goal UOP 2.5 L). Offered hydrochlorothiazide and/or potassium citrate which he declined after reviewed risk benefits.    Other additional stone prevention techniques reviewed.  Desires continued conservative management for his nonobstructing stones which is quite reasonable.   Return in about 1 year (around 03/22/2017) for KUB.   Or sooner as needed  Vanna Scotland, MD  [facility] [address] [place], [place] [address] [phone]

## 2017-03-20 ENCOUNTER — Ambulatory Visit: Payer: No Typology Code available for payment source | Admitting: Urology

## 2017-03-20 ENCOUNTER — Ambulatory Visit
Admission: RE | Admit: 2017-03-20 | Discharge: 2017-03-20 | Disposition: A | Discharge status: Home / Self Care | Payer: PRIVATE HEALTH INSURANCE | Source: Ambulatory Visit | Attending: Urology | Admitting: Urology

## 2017-03-20 ENCOUNTER — Encounter: Payer: Self-pay | Admitting: Urology

## 2017-03-20 VITALS — BP 143/89 | HR 73 | Ht 69.0 in | Wt 214.0 lb

## 2017-03-20 DIAGNOSIS — N200 Calculus of kidney: Secondary | ICD-10-CM | POA: Diagnosis present

## 2017-03-20 NOTE — Progress Notes (Signed)
03/20/2017 9:49 AM   Boneta Lucks [birth year] [id number]  Referring provider: No referring provider defined for this encounter.  Chief Complaint  Patient presents with  . Follow-up    HPI: 37 year old male with history of bilateral nephrolithiasis who returns today for one year follow up.   He has a history of bilateral nephrolithiasis, right greater than left who initially presented with multiple obstructing right ureteral stones. He underwent a staged procedure initially on 06/14/2015 to address his multiple obstructing ureteral stones followed by staged right ureteroscopy on 07/24/2015 to treat the remainder residual stones on his right side.  No hydronephrosis or f/u renal ultrasound.    He has passed stones in the past, most recently 5 years ago. He has seen [facility] around that time. He is also had ESWL times one which he reports was not successful.  Stone analysis shows stone composition calcium oxalate monohydrate 97%, calcium phosphate 3%.  Litholink performed on 09/2015 shows very low urinary volume of only 1.  His urinary calcium is borderline elevated at 223 mg/day.  He also has mild hyocitraturia, 436 mg/day.  SS CaOx 8.26.  Urinary pH 5.84.  Serum Cr 0.86, Ca 9.3, Co2 23.     KUB today reviewed from today.  Bilateral left greater than right nephrolithiasis remains stable.  He passed 2 stones just after being seen last year.  No other complaints at this time.  No current flank pain.     PMH: Past Medical History:  Diagnosis Date  . Nephrolithiasis     Surgical History: Past Surgical History:  Procedure Laterality Date  . CYSTOSCOPY WITH STENT PLACEMENT Right 07/24/2015   Procedure: CYSTOSCOPY WITH STENT PLACEMENT;  Surgeon: Vanna Scotland, MD;  Location: [facility];  Service: Urology;  Laterality: Right;  . CYSTOSCOPY/URETEROSCOPY/HOLMIUM LASER/STENT PLACEMENT Right 06/14/2015   Procedure: CYSTOSCOPY/  URETEROSCOPY /HOLMIUM LASERLITHOTRIPSY / STENT  PLACEMENT;  Surgeon: Vanna Scotland, MD;  Location: [facility];  Service: Urology;  Laterality: Right;  . EXTRACORPOREAL SHOCK WAVE LITHOTRIPSY  2006  . URETEROSCOPY WITH HOLMIUM LASER LITHOTRIPSY Right 07/24/2015   Procedure: URETEROSCOPY WITH HOLMIUM LASER LITHOTRIPSY;  Surgeon: Vanna Scotland, MD;  Location: [facility] ORS;  Service: Urology;  Laterality: Right;    Home Medications:  Allergies as of 03/20/2017   No Known Allergies     Medication List    as of 03/20/2017  9:49 AM   You have not been prescribed any medications.     Allergies: No Known Allergies  Family History: Family History  Problem Relation Age of Onset  . Bladder Cancer Neg Hx   . Prostate cancer Neg Hx   . Kidney cancer Neg Hx     Social History:  reports that  has never smoked. he has never used smokeless tobacco. He reports that he does not drink alcohol or use drugs.  ROS: UROLOGY Frequent Urination?: No Hard to postpone urination?: No Burning/pain with urination?: No Get up at night to urinate?: No Leakage of urine?: No Urine stream starts and stops?: No Trouble starting stream?: No Do you have to strain to urinate?: No Blood in urine?: No Urinary tract infection?: No Sexually transmitted disease?: No Injury to kidneys or bladder?: No Painful intercourse?: No Weak stream?: No Erection problems?: No Penile pain?: No  Gastrointestinal Nausea?: No Vomiting?: No Indigestion/heartburn?: No Diarrhea?: No Constipation?: No  Constitutional Fever: No Night sweats?: No Weight loss?: No Fatigue?: No  Skin Skin rash/lesions?: No Itching?: No  Eyes Blurred vision?: No Double vision?: No  Ears/Nose/Throat Sore throat?: No Sinus problems?: No  Hematologic/Lymphatic Swollen glands?: No Easy bruising?: No  Cardiovascular Leg swelling?: No Chest pain?: No  Respiratory Cough?: No Shortness of breath?: No  Endocrine Excessive thirst?: No  Musculoskeletal Back pain?: No Joint  pain?: No  Neurological Headaches?: No Dizziness?: No  Psychologic Depression?: No Anxiety?: No  Physical Exam: BP (!) 143/89   Pulse 73   Ht 5\' 9"  (1.753 m)   Wt 214 lb (97.1 kg)   BMI 31.60 kg/m   Constitutional:  Alert and oriented, No acute distress. HEENT: [place] AT, moist mucus membranes.  Trachea midline, no masses. Cardiovascular: No clubbing, cyanosis, or edema. Respiratory: Normal respiratory effort, no increased work of breathing. GI: Abdomen is soft, nontender, nondistended, no abdominal masses GU: No CVA tenderness.  Skin: No rashes, bruises or suspicious lesions. Lymph: No cervical or inguinal adenopathy. Neurologic: Grossly intact, no focal deficits, moving all 4 extremities. Psychiatric: Normal mood and affect.  Laboratory Data: Lab Results  Component Value Date   WBC 13.7 (H) 06/14/2015   HGB 15.7 06/14/2015   HCT 45.7 06/14/2015   MCV 82.2 06/14/2015   PLT 240 06/14/2015    Lab Results  Component Value Date   CREATININE 1.31 (H) 06/14/2015    No results found for: PSA  No results found for: TESTOSTERONE  No results found for: HGBA1C  Urinalysis    Component Value Date/Time   COLORURINE YELLOW (A) 06/14/2015 0446   APPEARANCEUR Clear 07/19/2015 0917   LABSPEC 1.020 06/14/2015 0446   PHURINE 6.0 06/14/2015 0446   GLUCOSEU Negative 07/19/2015 0917   HGBUR 3+ (A) 06/14/2015 0446   BILIRUBINUR Negative 07/19/2015 0917   KETONESUR NEGATIVE 06/14/2015 0446   PROTEINUR Negative 07/19/2015 0917   PROTEINUR NEGATIVE 06/14/2015 0446   NITRITE Negative 07/19/2015 0917   NITRITE NEGATIVE 06/14/2015 0446   LEUKOCYTESUR Negative 07/19/2015 0917    Pertinent Imaging: KUB reviewed from today which shows stable stone disease  Assessment & Plan:   1.  Bilateral nephrolithiasis Stones remained stable and KUB today.  Patient remains not interested in treating them as there is stable in size.  Again discussed ways to prevent stone formation.  In  particular, his litho-link results showed he was dehydrated in 2017.  Again reinforced the need for appropriate hydration.  Return in about 1 year (around 03/20/2018) for KUB prior.  Hildred LaserBrian James Woodward Klem, MD  [facility] [address], [address] [place], [place] [id number] [phone]

## 2017-06-18 IMAGING — CT CT RENAL STONE PROTOCOL
3 of 4 series · 9 of 46 positions shown, 14 images · non-contrast
Comparison: 03/11/2007

CLINICAL DATA: Right flank pain.

EXAM:
CT ABDOMEN AND PELVIS WITHOUT CONTRAST
TECHNIQUE: Multidetector CT imaging of the abdomen and pelvis was performed
following the standard protocol without IV contrast.

[Series 4: lung · axial · 0.77mm/px · z∈[-132,-37]mm · 5 of 29 slices shown, 10 images]
[im 5/29  soft-tissue]
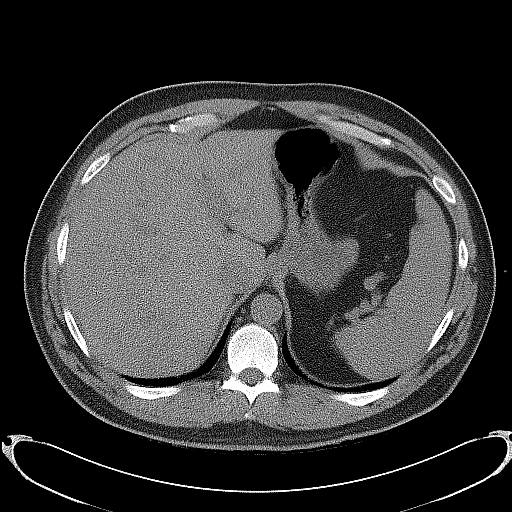
[im 5/29  bone]
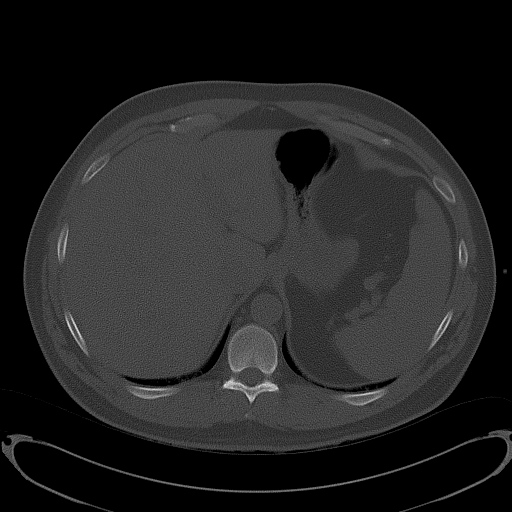
[im 10/29  soft-tissue]
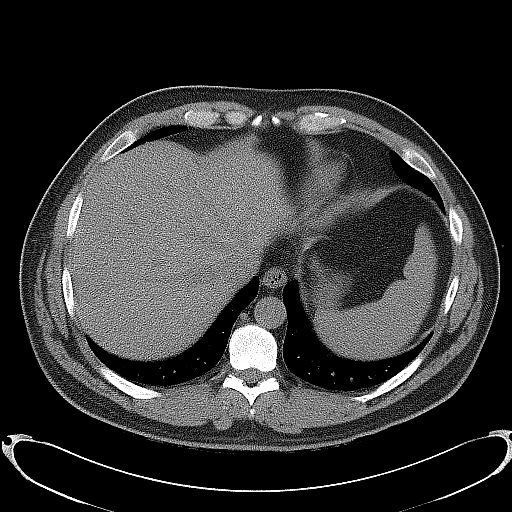
[im 10/29  lung]
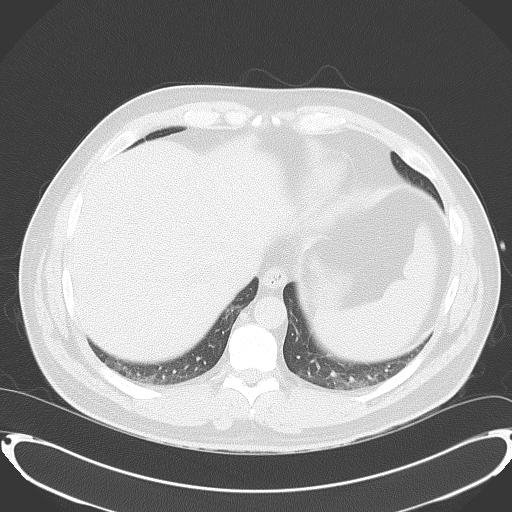
[im 15/29  soft-tissue]
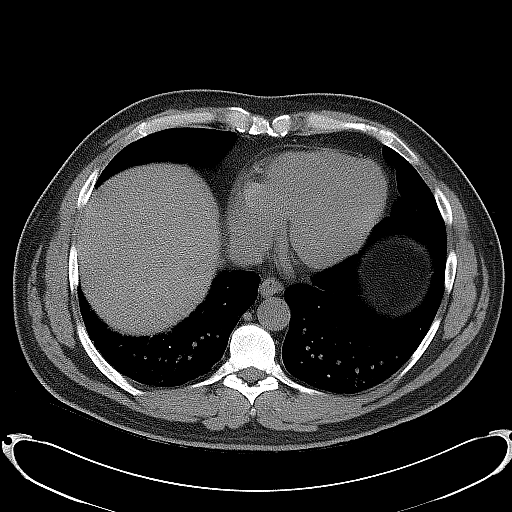
[im 15/29  lung]
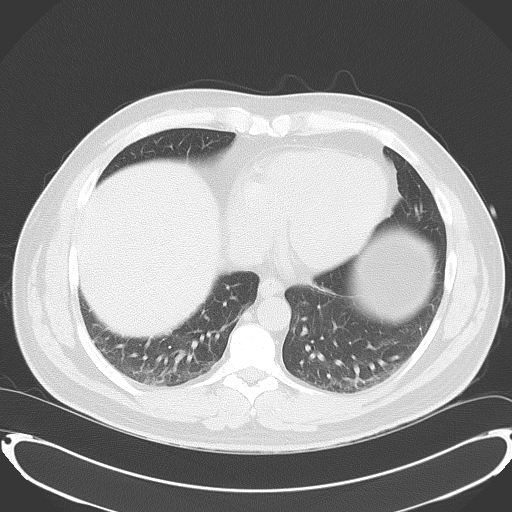
[im 19/29  soft-tissue]
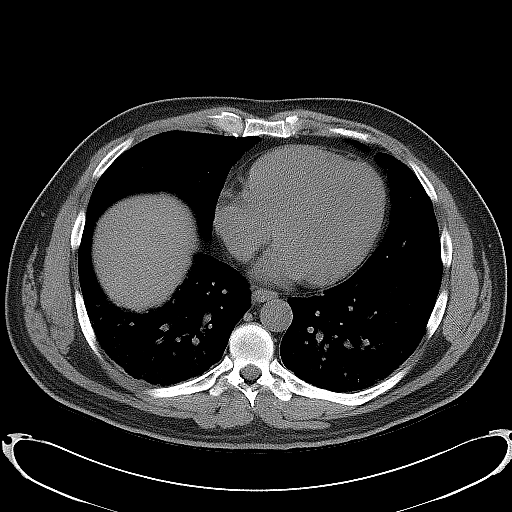
[im 19/29  lung]
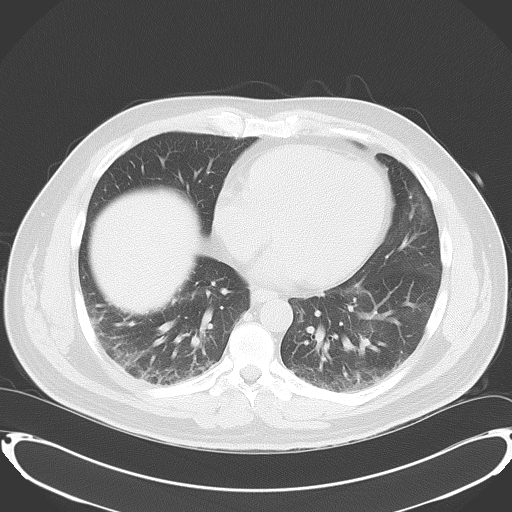
[im 24/29  soft-tissue]
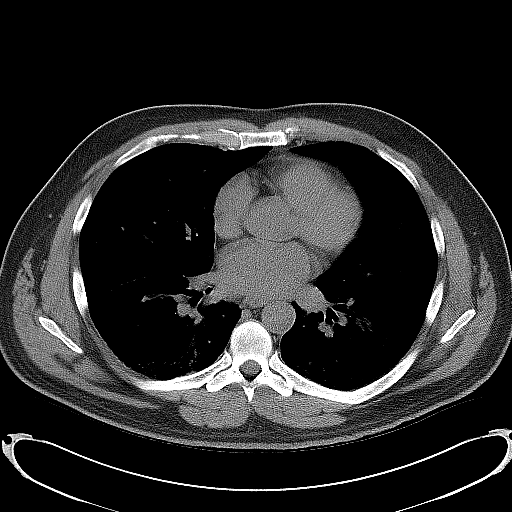
[im 24/29  lung]
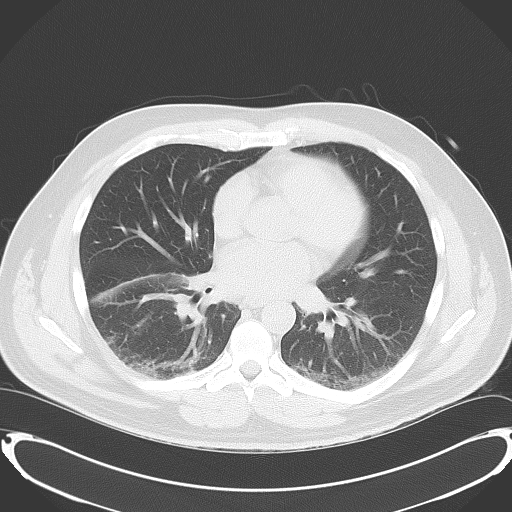

[Series 5: coronal · coronal · 0.75mm/px · 3 of 142 slices shown]
[im 48/142  soft-tissue]
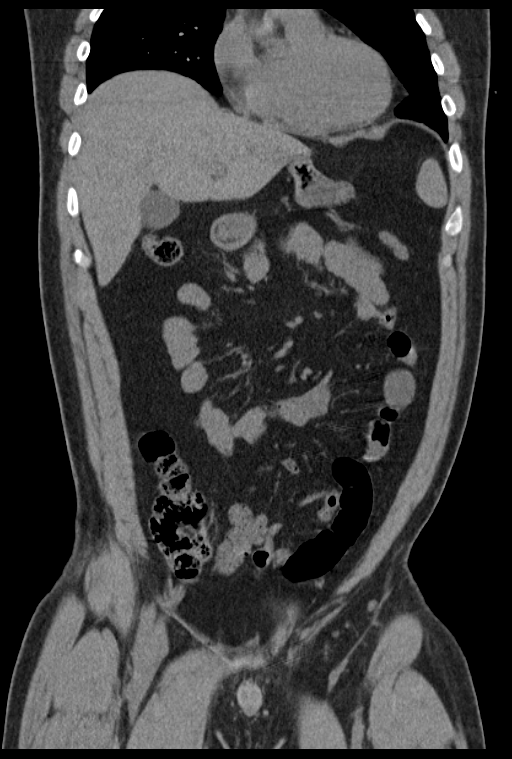
[im 63/142  soft-tissue]
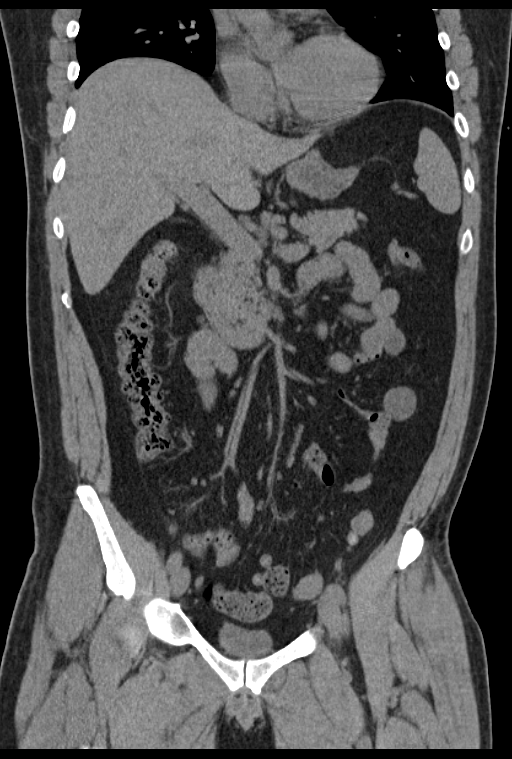
[im 79/142  soft-tissue]
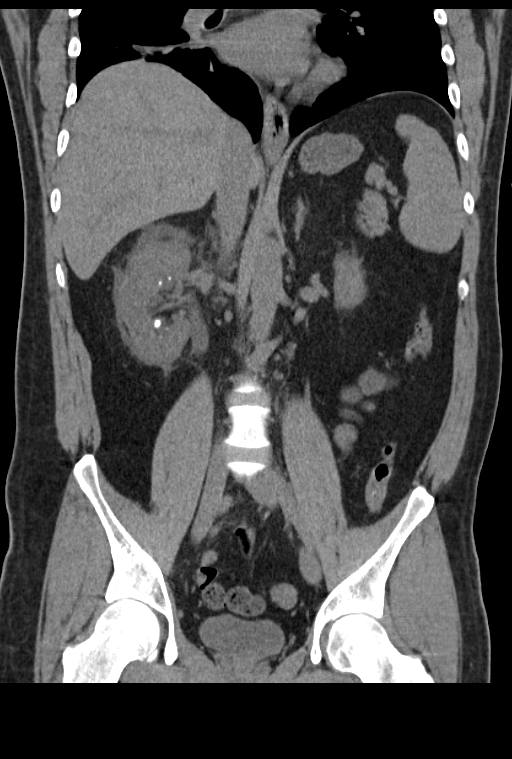

[Series 6: sagittal · sagittal · 0.66mm/px · 1 of 193 slices shown]
[im 65/193  soft-tissue]
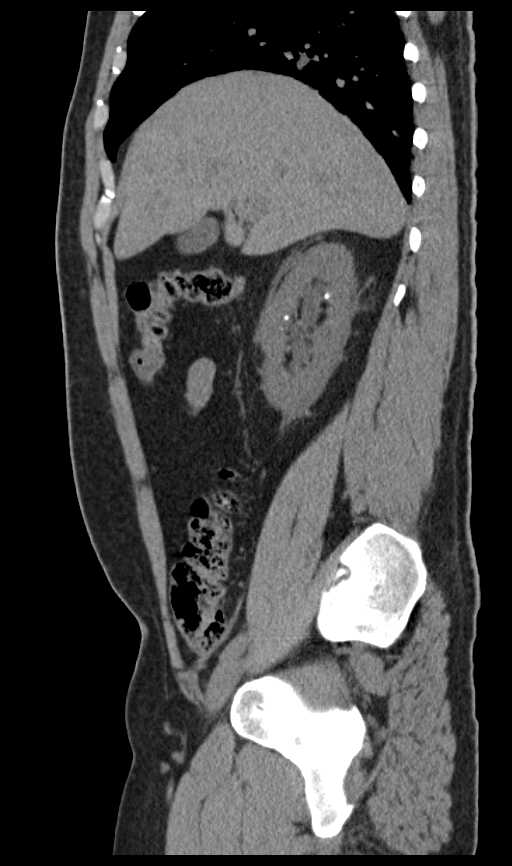

[9 of 46 positions shown; findings below may reference images not displayed]

FINDINGS: There are multiple renal calculi bilaterally. On the right there are
at least 11 in number, measuring up to 7.5 mm. On the left there are
at least 4 calculi, measuring up to 5 mm. There are 3 right ureteral
calculi. At the ureteropelvic junction there is a nonobstructing 6
mm calculus. At the low L3 level there is a 5 x 7 mm proximal right
ureteral calculus. In the distal right ureter there is another 5 x 7
mm calculus, 1.5 cm from the ureterovesical junction. There is
marked hydroureter and hydronephrosis on the right. There are no
left ureteral, no significant renal parenchymal lesions are evident
on this unenhanced scan.

There are unremarkable unenhanced appearances of the liver,
gallbladder, bile ducts, spleen, pancreas and adrenals. The
abdominal aorta is normal in caliber. There is no atherosclerotic
calcification. There is no adenopathy in the abdomen or pelvis.
There are normal appearances of the stomach, small bowel and colon.
The appendix is normal.

Mild atelectatic appearing posterior lung base opacities are
present. There is a noncalcified 7 mm nodule in the posterior
lateral periphery of the left lower lobe. This is unchanged from
03/11/2007 and is benign.

There is no significant skeletal lesion.
IMPRESSION: 1. Three right ureteral calculi, including a 5 x 7 mm distal
ureteral calculus 1.5 cm above the UVJ, a 5 x 7 mm proximal ureteral
calculus at the low L3 level, and a 6 mm nonobstructing calculus at
the ureteropelvic junction. Marked hydronephrosis and hydroureter.
2. Bilateral nephrolithiasis.
3. Benign 7 mm nodule in the posterolateral periphery of the left
lower lobe, not requiring additional evaluation.

## 2018-03-24 ENCOUNTER — Encounter: Payer: Self-pay | Admitting: Urology

## 2018-03-24 ENCOUNTER — Ambulatory Visit: Payer: PRIVATE HEALTH INSURANCE | Admitting: Urology
